# Patient Record
Sex: Female | Born: 1995
Health system: Southern US, Community
[De-identification: ages and names within clinical notes are randomized; demographics above are authoritative.]

## PROBLEM LIST (undated history)

## (undated) DIAGNOSIS — D709 Neutropenia, unspecified: Secondary | ICD-10-CM

## (undated) HISTORY — DX: Neutropenia, unspecified: D70.9

## (undated) HISTORY — PX: NO PAST SURGERIES: SHX2092

---
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
## 2017-03-13 ENCOUNTER — Ambulatory Visit (INDEPENDENT_AMBULATORY_CARE_PROVIDER_SITE_OTHER): Payer: Medicaid Other | Admitting: Family Medicine

## 2017-03-13 DIAGNOSIS — Z3042 Encounter for surveillance of injectable contraceptive: Secondary | ICD-10-CM

## 2017-03-13 MED ORDER — MEDROXYPROGESTERONE ACETATE 150 MG/ML IM SUSP
150.0000 mg | Freq: Once | INTRAMUSCULAR | Status: AC
  Administered 2017-03-13: 150 mg via INTRAMUSCULAR

## 2017-03-13 NOTE — Progress Notes (Signed)
Patient ID: Erica Johns, female   DOB: 1995-10-05, 21 y.o.   MRN: [id number]  Patient was seen in office today to receive her Depo Provera injection and states that she is feeling well today. Last depo injection was 12/20/16, patient was given injection in office today and tolerated injection well with no adverse reaction. Patient was instructed to return back to the office on or between the dates of 05/29/17-06/12/17. KW

## 2017-05-29 ENCOUNTER — Ambulatory Visit (INDEPENDENT_AMBULATORY_CARE_PROVIDER_SITE_OTHER): Payer: Medicaid Other | Admitting: Family Medicine

## 2017-05-29 DIAGNOSIS — Z3042 Encounter for surveillance of injectable contraceptive: Secondary | ICD-10-CM

## 2017-05-29 MED ORDER — MEDROXYPROGESTERONE ACETATE 150 MG/ML IM SUSP
150.0000 mg | Freq: Once | INTRAMUSCULAR | Status: AC
  Administered 2017-05-29: 150 mg via INTRAMUSCULAR

## 2017-05-29 NOTE — Progress Notes (Signed)
Pt is here today for her Depo provera injection today. Her last one was on 03/13/17. She was informed and scheduled to come back between 08/14/17 and 08/28/17 for her next injection. Pt tolerated injection well.

## 2017-08-14 ENCOUNTER — Ambulatory Visit (INDEPENDENT_AMBULATORY_CARE_PROVIDER_SITE_OTHER): Payer: Medicaid Other | Admitting: Family Medicine

## 2017-08-14 DIAGNOSIS — Z3042 Encounter for surveillance of injectable contraceptive: Secondary | ICD-10-CM | POA: Diagnosis not present

## 2017-08-14 MED ORDER — MEDROXYPROGESTERONE ACETATE 150 MG/ML IM SUSP
150.0000 mg | Freq: Once | INTRAMUSCULAR | Status: AC
  Administered 2017-08-14: 150 mg via INTRAMUSCULAR

## 2017-11-03 ENCOUNTER — Ambulatory Visit: Payer: Self-pay | Admitting: Family Medicine

## 2017-11-14 ENCOUNTER — Ambulatory Visit (INDEPENDENT_AMBULATORY_CARE_PROVIDER_SITE_OTHER): Payer: Medicaid Other | Admitting: Family Medicine

## 2017-11-14 DIAGNOSIS — Z3042 Encounter for surveillance of injectable contraceptive: Secondary | ICD-10-CM | POA: Diagnosis not present

## 2017-11-14 MED ORDER — MEDROXYPROGESTERONE ACETATE 150 MG/ML IM SUSP
150.0000 mg | Freq: Once | INTRAMUSCULAR | Status: AC
  Administered 2017-11-14: 150 mg via INTRAMUSCULAR

## 2017-11-14 NOTE — Progress Notes (Signed)
Pt here for her Depo injection her last on was 08/14/17. She was scheduled for her next today.

## 2017-11-18 ENCOUNTER — Telehealth: Payer: Self-pay

## 2017-11-18 NOTE — Telephone Encounter (Signed)
Patient requesting a copy of her immunizations. CB# [id number]

## 2017-11-18 NOTE — Telephone Encounter (Signed)
Immunization record printed at the front desk for pickup. Patient advised.

## 2018-01-30 ENCOUNTER — Encounter: Payer: Self-pay | Admitting: Family Medicine

## 2018-01-30 ENCOUNTER — Ambulatory Visit: Payer: Self-pay | Admitting: Family Medicine

## 2018-01-30 ENCOUNTER — Ambulatory Visit (INDEPENDENT_AMBULATORY_CARE_PROVIDER_SITE_OTHER): Payer: Medicaid Other | Admitting: Family Medicine

## 2018-01-30 DIAGNOSIS — Z3042 Encounter for surveillance of injectable contraceptive: Secondary | ICD-10-CM

## 2018-01-30 MED ORDER — MEDROXYPROGESTERONE ACETATE 150 MG/ML IM SUSP
150.0000 mg | Freq: Once | INTRAMUSCULAR | Status: AC
  Administered 2018-01-30: 150 mg via INTRAMUSCULAR

## 2018-01-30 NOTE — Progress Notes (Signed)
Patient was seen in office today to receive her Depo Provera injection and states that she is feeling well today. Last depo injection was 11/14/2017, patient was given injection in office today and tolerated injection well with no adverse reaction. Patient was instructed to return back to the office on or between the dates of 04/17/2018-05/01/2018

## 2018-04-17 ENCOUNTER — Ambulatory Visit: Payer: Medicaid Other | Admitting: Family Medicine

## 2018-05-19 ENCOUNTER — Ambulatory Visit (INDEPENDENT_AMBULATORY_CARE_PROVIDER_SITE_OTHER): Payer: Medicaid Other | Admitting: Family Medicine

## 2018-05-19 DIAGNOSIS — Z3042 Encounter for surveillance of injectable contraceptive: Secondary | ICD-10-CM | POA: Diagnosis not present

## 2018-05-19 LAB — POCT URINE PREGNANCY: PREG TEST UR: NEGATIVE

## 2018-05-19 MED ORDER — MEDROXYPROGESTERONE ACETATE 150 MG/ML IM SUSP
150.0000 mg | Freq: Once | INTRAMUSCULAR | Status: AC
  Administered 2018-05-19: 150 mg via INTRAMUSCULAR

## 2018-05-19 NOTE — Progress Notes (Deleted)
       Patient: Erica Johns Severe Female    DOB: 12-02-1995   22 y.o.   MRN: [id number] Visit Date: 05/19/2018  Today's Provider: Anola Gurneyobert Chauvin, PA   No chief complaint on file.  Subjective:     HPI  No Known Allergies   Current Outpatient Medications:  .  medroxyPROGESTERone (DEPO-PROVERA) 150 MG/ML injection, Inject 1 mL (150 mg total) into the muscle every 3 (three) months., Disp: 1 mL, Rfl: 3  Current Facility-Administered Medications:  .  medroxyPROGESTERone (DEPO-PROVERA) injection 150 mg, 150 mg, Intramuscular, Once, Anola Gurneyhauvin, Robert, PA  Review of Systems  Social History   Tobacco Use  . Smoking status: Never Smoker  . Smokeless tobacco: Never Used  Substance Use Topics  . Alcohol use: No    Alcohol/week: 0.0 standard drinks      Objective:   There were no vitals taken for this visit. There were no vitals filed for this visit.   Physical Exam      Assessment & Plan        Anola Gurneyobert Chauvin, PA  [facility]

## 2018-05-19 NOTE — Progress Notes (Signed)
Depo- Provera IM Injection  Contraception Management  Medication: Medroxyprogesterone Dose: 150mg /ML Location: right upper outer buttocks Lot: [id number] Exp:12/2018  Patient tolerated well, no complications were noted   Preformed by: Nicholos JohnsKathleen. W, NCMA  Follow up: between 08/05/18-08/19/18 for next injection

## 2018-06-10 ENCOUNTER — Encounter: Payer: Self-pay | Admitting: Family Medicine

## 2018-06-10 ENCOUNTER — Ambulatory Visit (INDEPENDENT_AMBULATORY_CARE_PROVIDER_SITE_OTHER): Payer: Medicaid Other | Admitting: Family Medicine

## 2018-06-10 VITALS — BP 106/60 | HR 76 | Temp 98.4°F | Resp 16 | Wt 117.0 lb

## 2018-06-10 DIAGNOSIS — R221 Localized swelling, mass and lump, neck: Secondary | ICD-10-CM

## 2018-06-10 NOTE — Progress Notes (Signed)
  Subjective:     Patient ID: Erica Johns, female   DOB: 01-24-1996, 23 y.o.   MRN: [id number] Chief Complaint  Patient presents with  . Mass    Right side of neck.  First noticed a few weeks ago.    HPI States she has not had a recent cold and only felt it while cleaning her neck. She is accompanied by her boyfriend today.  Review of Systems     Objective:   Physical Exam Constitutional:      General: She is not in acute distress. Skin:    Comments: Right anterior cervical area with pea-sized mobile, non-tender mass  Neurological:     Mental Status: She is alert.        Assessment:    1. Mass of right side of neck: ? Cyst-as patient is worried about this will refer for more definitive diagnosis. - Ambulatory referral to General Surgery    Plan:    Further f/u pending surgical evaluation.

## 2018-06-10 NOTE — Patient Instructions (Signed)
We will call you with the surgical referral.

## 2018-06-17 ENCOUNTER — Ambulatory Visit: Payer: Self-pay | Admitting: General Surgery

## 2018-08-05 ENCOUNTER — Ambulatory Visit: Payer: Self-pay

## 2018-08-13 ENCOUNTER — Ambulatory Visit (INDEPENDENT_AMBULATORY_CARE_PROVIDER_SITE_OTHER): Payer: Medicaid Other | Admitting: Physician Assistant

## 2018-08-13 ENCOUNTER — Other Ambulatory Visit: Payer: Self-pay

## 2018-08-13 ENCOUNTER — Encounter: Payer: Self-pay | Admitting: Physician Assistant

## 2018-08-13 VITALS — BP 109/72 | HR 81 | Temp 98.8°F | Wt 118.4 lb

## 2018-08-13 DIAGNOSIS — R0982 Postnasal drip: Secondary | ICD-10-CM

## 2018-08-13 DIAGNOSIS — Z3042 Encounter for surveillance of injectable contraceptive: Secondary | ICD-10-CM

## 2018-08-13 LAB — POCT URINE PREGNANCY: Preg Test, Ur: NEGATIVE

## 2018-08-13 MED ORDER — MEDROXYPROGESTERONE ACETATE 150 MG/ML IM SUSP
150.0000 mg | Freq: Once | INTRAMUSCULAR | Status: AC
  Administered 2018-08-13: 150 mg via INTRAMUSCULAR

## 2018-08-13 NOTE — Progress Notes (Signed)
       Patient: Erica Johns Female    DOB: [birth year]   22 y.o.   MRN: [id number] Visit Date: 08/16/2018  Today's Provider: Trey Sailors, PA-C   Chief Complaint  Patient presents with  . URI   Subjective:    URI   The current episode started in the past 7 days. The problem has been unchanged. There has been no fever. Associated symptoms include congestion and coughing. She has tried decongestant, acetaminophen and NSAIDs for the symptoms. The treatment provided mild relief.   Patient reports she feels like she has mucous running down the back of her throat. Denies travel, contacts with sick people who have traveled.   No Known Allergies   Current Outpatient Medications:  .  medroxyPROGESTERone (DEPO-PROVERA) 150 MG/ML injection, Inject 1 mL (150 mg total) into the muscle every 3 (three) months., Disp: 1 mL, Rfl: 3  Review of Systems  Constitutional: Negative.   HENT: Positive for congestion.   Respiratory: Positive for cough.   Cardiovascular: Negative.   Gastrointestinal: Negative.   Neurological: Negative.     Social History   Tobacco Use  . Smoking status: Never Smoker  . Smokeless tobacco: Never Used  Substance Use Topics  . Alcohol use: No    Alcohol/week: 0.0 standard drinks      Objective:   BP 109/72 (BP Location: Left Arm, Patient Position: Sitting, Cuff Size: Normal)   Pulse 81   Temp 98.8 F (37.1 C) (Oral)   Wt 118 lb 6.4 oz (53.7 kg)   LMP 07/17/2018   SpO2 99%  Vitals:   08/13/18 1625  BP: 109/72  Pulse: 81  Temp: 98.8 F (37.1 C)  TempSrc: Oral  SpO2: 99%  Weight: 118 lb 6.4 oz (53.7 kg)     Physical Exam Constitutional:      Appearance: Normal appearance.  HENT:     Right Ear: Tympanic membrane and ear canal normal.     Left Ear: Tympanic membrane and ear canal normal.     Mouth/Throat:     Mouth: Mucous membranes are moist.     Pharynx: Oropharynx is clear.  Cardiovascular:     Rate and Rhythm: Normal rate and  regular rhythm.     Pulses: Normal pulses.     Heart sounds: Normal heart sounds.  Pulmonary:     Effort: Pulmonary effort is normal.     Breath sounds: Normal breath sounds.  Lymphadenopathy:     Cervical: No cervical adenopathy.  Skin:    General: Skin is warm and dry.  Neurological:     Mental Status: She is alert and oriented to person, place, and time. Mental status is at baseline.  Psychiatric:        Mood and Affect: Mood normal.        Behavior: Behavior normal.         Assessment & Plan    1. Encounter for Depo-Provera contraception  Urine pregnancy negative.  - POCT urine pregnancy - medroxyPROGESTERone (DEPO-PROVERA) injection 150 mg  2. PND (post-nasal drip)  2nd gen antihistamine.  F/u 3 mo for depo.      Trey Sailors, PA-C  [facility]

## 2018-08-16 NOTE — Patient Instructions (Signed)
Postnasal Drip  Postnasal drip is the feeling of mucus going down the back of your throat. Mucus is a slimy substance that moistens and cleans your nose and throat, as well as the air pockets in face bones near your forehead and cheeks (sinuses). Small amounts of mucus pass from your nose and sinuses down the back of your throat all the time. This is normal. When you produce too much mucus or the mucus gets too thick, you can feel it.  Some common causes of postnasal drip include:   Having more mucus because of:  ? A cold or the flu.  ? Allergies.  ? Cold air.  ? Certain medicines.   Having more mucus that is thicker because of:  ? A sinus or nasal infection.  ? Dry air.  ? A food allergy.  Follow these instructions at home:  Relieving discomfort     Gargle with a salt-water mixture 3-4 times a day or as needed. To make a salt-water mixture, completely dissolve -1 tsp of salt in 1 cup of warm water.   If the air in your home is dry, use a humidifier to add moisture to the air.   Use a saline spray or container (neti pot) to flush out the nose (nasal irrigation). These methods can help clear away mucus and keep the nasal passages moist.  General instructions   Take over-the-counter and prescription medicines only as told by your health care provider.   Follow instructions from your health care provider about eating or drinking restrictions. You may need to avoid caffeine.   Avoid things that you know you are allergic to (allergens), like dust, mold, pollen, pets, or certain foods.   Drink enough fluid to keep your urine pale yellow.   Keep all follow-up visits as told by your health care provider. This is important.  Contact a health care provider if:   You have a fever.   You have a sore throat.   You have difficulty swallowing.   You have headache.   You have sinus pain.   You have a cough that does not go away.   The mucus from your nose becomes thick and is green or yellow in color.   You have  cold or flu symptoms that last more than 10 days.  Summary   Postnasal drip is the feeling of mucus going down the back of your throat.   If your health care provider approves, use nasal irrigation or a nasal spray 2?4 times a day.   Avoid things that you know you are allergic to (allergens), like dust, mold, pollen, pets, or certain foods.  This information is not intended to replace advice given to you by your health care provider. Make sure you discuss any questions you have with your health care provider.  Document Released: 09/02/2016 Document Revised: 09/02/2016 Document Reviewed: 09/02/2016  Elsevier Interactive Patient Education  2019 [employer]

## 2018-11-04 ENCOUNTER — Encounter: Payer: Self-pay | Admitting: *Deleted

## 2018-11-13 ENCOUNTER — Ambulatory Visit: Payer: Self-pay | Admitting: Physician Assistant

## 2018-11-16 ENCOUNTER — Encounter: Payer: Self-pay | Admitting: *Deleted

## 2018-11-19 NOTE — Progress Notes (Signed)
resent

## 2018-12-11 ENCOUNTER — Other Ambulatory Visit: Payer: Self-pay

## 2018-12-11 ENCOUNTER — Ambulatory Visit (INDEPENDENT_AMBULATORY_CARE_PROVIDER_SITE_OTHER): Payer: Medicaid Other | Admitting: Physician Assistant

## 2018-12-11 VITALS — Temp 98.7°F

## 2018-12-11 DIAGNOSIS — Z3042 Encounter for surveillance of injectable contraceptive: Secondary | ICD-10-CM

## 2018-12-11 LAB — POCT URINE PREGNANCY: Preg Test, Ur: NEGATIVE

## 2018-12-11 MED ORDER — MEDROXYPROGESTERONE ACETATE 150 MG/ML IM SUSP
150.0000 mg | Freq: Once | INTRAMUSCULAR | Status: AC
  Administered 2018-12-11: 150 mg via INTRAMUSCULAR

## 2018-12-11 NOTE — Progress Notes (Signed)
Patient here today for medroxyProgesterone. Patient last had injection on 08/13/2018. A POC pregnancy test was done today and was negative. Patient tolerated injection well. Patient reports good tolerance of medication.

## 2019-02-26 ENCOUNTER — Ambulatory Visit: Payer: Self-pay | Admitting: Physician Assistant

## 2019-04-27 ENCOUNTER — Other Ambulatory Visit: Payer: Self-pay

## 2019-04-27 ENCOUNTER — Ambulatory Visit (INDEPENDENT_AMBULATORY_CARE_PROVIDER_SITE_OTHER): Payer: Medicaid Other | Admitting: Physician Assistant

## 2019-04-27 DIAGNOSIS — Z3042 Encounter for surveillance of injectable contraceptive: Secondary | ICD-10-CM

## 2019-04-27 DIAGNOSIS — Z3200 Encounter for pregnancy test, result unknown: Secondary | ICD-10-CM

## 2019-04-27 LAB — POCT URINE PREGNANCY: Preg Test, Ur: NEGATIVE

## 2019-04-27 MED ORDER — METHYLPREDNISOLONE ACETATE 40 MG/ML IJ SUSP
40.0000 mg | Freq: Once | INTRAMUSCULAR | Status: AC
  Administered 2019-04-27: 40 mg via INTRAMUSCULAR

## 2019-04-28 ENCOUNTER — Ambulatory Visit (INDEPENDENT_AMBULATORY_CARE_PROVIDER_SITE_OTHER): Payer: Medicaid Other | Admitting: Physician Assistant

## 2019-04-28 DIAGNOSIS — Z3042 Encounter for surveillance of injectable contraceptive: Secondary | ICD-10-CM

## 2019-04-28 MED ORDER — MEDROXYPROGESTERONE ACETATE 150 MG/ML IM SUSP
150.0000 mg | Freq: Once | INTRAMUSCULAR | Status: AC
  Administered 2019-04-28: 150 mg via INTRAMUSCULAR

## 2019-04-28 NOTE — Progress Notes (Signed)
Patient presented today for depo-provera birth control shot. Patient was mistakenly given 40 mg depo-medrol. As soon as this was realized, I personally called patient at approximately 3:00 PM on 04/27/2019 and explained the error. Counseled patient that the medication should not harm her and there is no reason she could not have it. Have explained risk of some discoloration or atrophy at the injection site and symptoms to watch out for. We will have her return tomorrow for the depo-provera shot.

## 2019-04-28 NOTE — Progress Notes (Signed)
       Patient: Erica Johns Female    DOB: 07-18-1995   23 y.o.   MRN: [id number] Visit Date: 04/28/2019  Today's Provider: Trinna Post, PA-C   Chief Complaint  Patient presents with  . Encounter for Depo-Provera Contraception   Subjective:     HPI  Encounter for Depo-Provera Contraception Patient presents today for depo-provera injection. Patient last injection was on 12/11/2018. POC pregnancy test was done on 04/27/2019 and was negative. Patient tolerated injection well.  Patient presents today for birth control injection. Yesterday she was mistakenly given 40 mg depo-medrol rather than depo-provera. Patient was alerted to mistake. She reports some slight soreness at injection site but otherwise no adverse reactions today.    No Known Allergies  No current outpatient medications on file.  Current Facility-Administered Medications:  .  medroxyPROGESTERone (DEPO-PROVERA) injection 150 mg, 150 mg, Intramuscular, Once, [place], Jazalyn M, PA-C  Review of Systems  Social History   Tobacco Use  . Smoking status: Never Smoker  . Smokeless tobacco: Never Used  Substance Use Topics  . Alcohol use: No    Alcohol/week: 0.0 standard drinks      Objective:   There were no vitals taken for this visit. There were no vitals filed for this visit.There is no height or weight on file to calculate BMI.   Physical Exam Constitutional:      Appearance: Normal appearance.  Cardiovascular:     Rate and Rhythm: Normal rate and regular rhythm.  Pulmonary:     Effort: Pulmonary effort is normal.     Breath sounds: Normal breath sounds.  Skin:    General: Skin is warm and dry.  Neurological:     Mental Status: She is alert and oriented to person, place, and time. Mental status is at baseline.  Psychiatric:        Mood and Affect: Mood normal.        Behavior: Behavior normal.      No results found for any visits on 04/28/19.     Assessment & Plan    1. Encounter  for Depo-Provera contraception   We will have her come back next year for CPE and PAP as she has turned 23.   - medroxyPROGESTERone (DEPO-PROVERA) injection 150 mg  The entirety of the information documented in the History of Present Illness, Review of Systems and Physical Exam were personally obtained by me. Portions of this information were initially documented by [facility], CMA and reviewed by me for thoroughness and accuracy.          Trinna Post, PA-C  [facility]

## 2019-07-29 ENCOUNTER — Other Ambulatory Visit: Payer: Self-pay

## 2019-07-29 ENCOUNTER — Encounter: Payer: Self-pay | Admitting: Physician Assistant

## 2019-07-29 ENCOUNTER — Ambulatory Visit (INDEPENDENT_AMBULATORY_CARE_PROVIDER_SITE_OTHER): Payer: BC Managed Care – PPO | Admitting: Physician Assistant

## 2019-07-29 ENCOUNTER — Other Ambulatory Visit (HOSPITAL_COMMUNITY)
Admission: RE | Admit: 2019-07-29 | Discharge: 2019-07-29 | Disposition: A | Discharge status: Home / Self Care | Payer: BC Managed Care – PPO | Source: Ambulatory Visit | Attending: Physician Assistant | Admitting: Physician Assistant

## 2019-07-29 VITALS — BP 111/66 | HR 80 | Temp 96.8°F | Ht 64.0 in | Wt 122.8 lb

## 2019-07-29 DIAGNOSIS — Z0000 Encounter for general adult medical examination without abnormal findings: Secondary | ICD-10-CM

## 2019-07-29 DIAGNOSIS — Z3042 Encounter for surveillance of injectable contraceptive: Secondary | ICD-10-CM | POA: Diagnosis not present

## 2019-07-29 DIAGNOSIS — D709 Neutropenia, unspecified: Secondary | ICD-10-CM | POA: Diagnosis not present

## 2019-07-29 DIAGNOSIS — Z124 Encounter for screening for malignant neoplasm of cervix: Secondary | ICD-10-CM

## 2019-07-29 MED ORDER — MEDROXYPROGESTERONE ACETATE 150 MG/ML IM SUSP
150.0000 mg | Freq: Once | INTRAMUSCULAR | Status: AC
  Administered 2019-07-29: 150 mg via INTRAMUSCULAR

## 2019-07-29 NOTE — Progress Notes (Signed)
Patient: Erica Johns, Female    DOB: 07-Nov-1995, 24 y.o.   MRN: [id number] Visit Date: 07/29/2019  Today's Provider: Trinna Post, PA-C   No chief complaint on file.  Subjective:    Annual physical exam Erica Johns is a 24 y.o. female who presents today for health maintenance and complete physical. She feels well. She reports she is not exercising. She reports she is sleeping well.  Due for PAP and tetanus shot today.  -----------------------------------------------------------------   Review of Systems  Constitutional: Negative.   HENT: Negative.   Eyes: Negative.   Respiratory: Negative.   Cardiovascular: Negative.   Gastrointestinal: Negative.   Endocrine: Negative.   Genitourinary: Negative.   Musculoskeletal: Negative.   Skin: Negative.   Allergic/Immunologic: Negative.   Neurological: Negative.   Hematological: Negative.   Psychiatric/Behavioral: Negative.     Social History She  reports that she has never smoked. She has never used smokeless tobacco. She reports that she does not drink alcohol or use drugs. Social History   Socioeconomic History  . Marital status: Single    Spouse name: Not on file  . Number of children: Not on file  . Years of education: Not on file  . Highest education level: Not on file  Occupational History  . Not on file  Tobacco Use  . Smoking status: Never Smoker  . Smokeless tobacco: Never Used  Substance and Sexual Activity  . Alcohol use: No    Alcohol/week: 0.0 standard drinks  . Drug use: No  . Sexual activity: Not on file  Other Topics Concern  . Not on file  Social History Narrative  . Not on file   Social Determinants of Health   Financial Resource Strain:   . Difficulty of Paying Living Expenses: Not on file  Food Insecurity:   . Worried About Charity fundraiser in the Last Year: Not on file  . Ran Out of Food in the Last Year: Not on file  Transportation Needs:   . Lack of  Transportation (Medical): Not on file  . Lack of Transportation (Non-Medical): Not on file  Physical Activity:   . Days of Exercise per Week: Not on file  . Minutes of Exercise per Session: Not on file  Stress:   . Feeling of Stress : Not on file  Social Connections:   . Frequency of Communication with Friends and Family: Not on file  . Frequency of Social Gatherings with Friends and Family: Not on file  . Attends Religious Services: Not on file  . Active Member of Clubs or Organizations: Not on file  . Attends Archivist Meetings: Not on file  . Marital Status: Not on file    There are no problems to display for this patient.   Past Surgical History:  Procedure Laterality Date  . NO PAST SURGERIES      Family History  Family Status  Relation Name Status  . Mother  Alive  . Father  Alive  . Sister  Alive  . Brother  Alive  . MGM  Deceased  . MGF  Alive  . PGM  Alive  . PGF  Deceased   Her family history includes Diabetes in her maternal grandmother; Healthy in her brother, father, maternal grandfather, mother, paternal grandmother, and sister.     No Known Allergies  Previous Medications   No medications on file    Patient Care Team: [place], [place] as  PCP - General (Physician Assistant)      Objective:   Vitals: There were no vitals taken for this visit.   Physical Exam Constitutional:      Appearance: Normal appearance.  Cardiovascular:     Rate and Rhythm: Normal rate and regular rhythm.     Heart sounds: Normal heart sounds.  Pulmonary:     Effort: Pulmonary effort is normal.     Breath sounds: Normal breath sounds.  Chest:     Chest wall: No mass.     Breasts:        Right: Normal.        Left: Normal.  Abdominal:     General: Bowel sounds are normal.     Palpations: Abdomen is soft.     Tenderness: There is no abdominal tenderness.  Skin:    General: Skin is warm and dry.  Neurological:     Mental Status: She is alert  and oriented to person, place, and time. Mental status is at baseline.  Psychiatric:        Mood and Affect: Mood normal.        Behavior: Behavior normal.      Depression Screen No flowsheet data found.    Assessment & Plan:     Routine Health Maintenance and Physical Exam  Exercise Activities and Dietary recommendations Goals   None     Immunization History  Administered Date(s) Administered  . DTaP 12/27/1997, 03/08/1998, 05/24/1998, 06/14/1999, 08/13/2002  . HPV Quadrivalent 09/02/2014  . Hepatitis A 09/02/2014  . Hepatitis B Oct 13, 1995, 11/25/1997, 05/24/1998  . HiB (PRP-OMP) 12/27/1997, 03/08/1998, 05/24/1998, 01/22/1999  . IPV 12/27/1997, 03/08/1998, 05/24/1998, 08/13/2002  . MMR 06/14/1999, 08/13/2002  . Meningococcal Conjugate 09/02/2014  . Td 11/28/2008  . Tdap 11/28/2008  . Varicella 01/22/1999, 12/27/2008    Health Maintenance  Topic Date Due  . PAP-Cervical Cytology Screening  10/27/2018  . PAP SMEAR-Modifier  10/27/2018  . TETANUS/TDAP  11/29/2018  . INFLUENZA VACCINE  01/02/2019  . HIV Screening  04/27/2020 (Originally 10/26/2012)     Discussed health benefits of physical activity, and encouraged her to engage in regular exercise appropriate for her age and condition.    1. Annual physical exam   2. Encounter for Depo-Provera contraception  - medroxyPROGESTERone (DEPO-PROVERA) injection 150 mg  3. Encounter for annual physical examination excluding gynecological examination in a patient older than 17 years  - Cytology - PAP - HIV Antibody (routine testing w rflx) - TSH - Lipid panel - Comprehensive metabolic panel - CBC with Differential/Platelet  4. Cervical cancer screening  The entirety of the information documented in the History of Present Illness, Review of Systems and Physical Exam were personally obtained by me. Portions of this information were initially documented by Elonda Husky, CMA and reviewed by me for thoroughness  and accuracy.    --------------------------------------------------------------------

## 2019-07-29 NOTE — Patient Instructions (Signed)
Health Maintenance, Female Adopting a healthy lifestyle and getting preventive care are important in promoting health and wellness. Ask your health care provider about:  The right schedule for you to have regular tests and exams.  Things you can do on your own to prevent diseases and keep yourself healthy. What should I know about diet, weight, and exercise? Eat a healthy diet   Eat a diet that includes plenty of vegetables, fruits, low-fat dairy products, and lean protein.  Do not eat a lot of foods that are high in solid fats, added sugars, or sodium. Maintain a healthy weight Body mass index (BMI) is used to identify weight problems. It estimates body fat based on height and weight. Your health care provider can help determine your BMI and help you achieve or maintain a healthy weight. Get regular exercise Get regular exercise. This is one of the most important things you can do for your health. Most adults should:  Exercise for at least 150 minutes each week. The exercise should increase your heart rate and make you sweat (moderate-intensity exercise).  Do strengthening exercises at least twice a week. This is in addition to the moderate-intensity exercise.  Spend less time sitting. Even light physical activity can be beneficial. Watch cholesterol and blood lipids Have your blood tested for lipids and cholesterol at 22 years of age, then have this test every 5 years. Have your cholesterol levels checked more often if:  Your lipid or cholesterol levels are high.  You are older than 22 years of age.  You are at high risk for heart disease. What should I know about cancer screening? Depending on your health history and family history, you may need to have cancer screening at various ages. This may include screening for:  Breast cancer.  Cervical cancer.  Colorectal cancer.  Skin cancer.  Lung cancer. What should I know about heart disease, diabetes, and high blood  pressure? Blood pressure and heart disease  High blood pressure causes heart disease and increases the risk of stroke. This is more likely to develop in people who have high blood pressure readings, are of African descent, or are overweight.  Have your blood pressure checked: ? Every 3-5 years if you are 18-39 years of age. ? Every year if you are 40 years old or older. Diabetes Have regular diabetes screenings. This checks your fasting blood sugar level. Have the screening done:  Once every three years after age 40 if you are at a normal weight and have a low risk for diabetes.  More often and at a younger age if you are overweight or have a high risk for diabetes. What should I know about preventing infection? Hepatitis B If you have a higher risk for hepatitis B, you should be screened for this virus. Talk with your health care provider to find out if you are at risk for hepatitis B infection. Hepatitis C Testing is recommended for:  Everyone born from 1945 through 1965.  Anyone with known risk factors for hepatitis C. Sexually transmitted infections (STIs)  Get screened for STIs, including gonorrhea and chlamydia, if: ? You are sexually active and are younger than 22 years of age. ? You are older than 22 years of age and your health care provider tells you that you are at risk for this type of infection. ? Your sexual activity has changed since you were last screened, and you are at increased risk for chlamydia or gonorrhea. Ask your health care provider if   you are at risk.  Ask your health care provider about whether you are at high risk for HIV. Your health care provider may recommend a prescription medicine to help prevent HIV infection. If you choose to take medicine to prevent HIV, you should first get tested for HIV. You should then be tested every 3 months for as long as you are taking the medicine. Pregnancy  If you are about to stop having your period (premenopausal) and  you may become pregnant, seek counseling before you get pregnant.  Take 400 to 800 micrograms (mcg) of folic acid every day if you become pregnant.  Ask for birth control (contraception) if you want to prevent pregnancy. Osteoporosis and menopause Osteoporosis is a disease in which the bones lose minerals and strength with aging. This can result in bone fractures. If you are 65 years old or older, or if you are at risk for osteoporosis and fractures, ask your health care provider if you should:  Be screened for bone loss.  Take a calcium or vitamin D supplement to lower your risk of fractures.  Be given hormone replacement therapy (HRT) to treat symptoms of menopause. Follow these instructions at home: Lifestyle  Do not use any products that contain nicotine or tobacco, such as cigarettes, e-cigarettes, and chewing tobacco. If you need help quitting, ask your health care provider.  Do not use street drugs.  Do not share needles.  Ask your health care provider for help if you need support or information about quitting drugs. Alcohol use  Do not drink alcohol if: ? Your health care provider tells you not to drink. ? You are pregnant, may be pregnant, or are planning to become pregnant.  If you drink alcohol: ? Limit how much you use to 0-1 drink a day. ? Limit intake if you are breastfeeding.  Be aware of how much alcohol is in your drink. In the [place] one drink equals one 12 oz bottle of beer (355 mL), one 5 oz glass of wine (148 mL), or one 1 oz glass of hard liquor (44 mL). General instructions  Schedule regular health, dental, and eye exams.  Stay current with your vaccines.  Tell your health care provider if: ? You often feel depressed. ? You have ever been abused or do not feel safe at home. Summary  Adopting a healthy lifestyle and getting preventive care are important in promoting health and wellness.  Follow your health care provider's instructions about healthy  diet, exercising, and getting tested or screened for diseases.  Follow your health care provider's instructions on monitoring your cholesterol and blood pressure. This information is not intended to replace advice given to you by your health care provider. Make sure you discuss any questions you have with your health care provider. Document Revised: 05/13/2018 Document Reviewed: 05/13/2018 Elsevier Patient Education  2020 Elsevier Inc.  

## 2019-07-30 LAB — LIPID PANEL
Chol/HDL Ratio: 2.3 ratio (ref 0.0–4.4)
Cholesterol, Total: 131 mg/dL (ref 100–199)
HDL: 58 mg/dL (ref >39)
LDL Chol Calc (NIH): 59 mg/dL (ref 0–99)
Triglycerides: 67 mg/dL (ref 0–149)
VLDL Cholesterol Cal: 14 mg/dL (ref 5–40)

## 2019-07-30 LAB — COMPREHENSIVE METABOLIC PANEL
ALT: 27 IU/L (ref 0–32)
AST: 22 IU/L (ref 0–40)
Albumin/Globulin Ratio: 1.9 (ref 1.2–2.2)
Albumin: 4.6 g/dL (ref 3.9–5.0)
Alkaline Phosphatase: 87 IU/L (ref 39–117)
BUN/Creatinine Ratio: 16 (ref 9–23)
BUN: 11 mg/dL (ref 6–20)
Bilirubin Total: 0.4 mg/dL (ref 0.0–1.2)
CO2: 22 mmol/L (ref 20–29)
Calcium: 9.4 mg/dL (ref 8.7–10.2)
Chloride: 105 mmol/L (ref 96–106)
Creatinine, Ser: 0.7 mg/dL (ref 0.57–1.00)
GFR calc Af Amer: 143 mL/min/{1.73_m2} (ref >59)
GFR calc non Af Amer: 124 mL/min/{1.73_m2} (ref >59)
Globulin, Total: 2.4 g/dL (ref 1.5–4.5)
Glucose: 64 mg/dL — ABNORMAL LOW (ref 65–99)
Potassium: 3.7 mmol/L (ref 3.5–5.2)
Sodium: 141 mmol/L (ref 134–144)
Total Protein: 7 g/dL (ref 6.0–8.5)

## 2019-07-30 LAB — CYTOLOGY - PAP
Chlamydia: NEGATIVE
Comment: NEGATIVE
Comment: NORMAL
Diagnosis: NEGATIVE
Neisseria Gonorrhea: NEGATIVE

## 2019-07-30 LAB — CBC WITH DIFFERENTIAL/PLATELET
Basophils Absolute: 0 10*3/uL (ref 0.0–0.2)
Basos: 0 %
EOS (ABSOLUTE): 0.1 10*3/uL (ref 0.0–0.4)
Eos: 3 %
Hematocrit: 37.2 % (ref 34.0–46.6)
Hemoglobin: 11.7 g/dL (ref 11.1–15.9)
Immature Grans (Abs): 0 10*3/uL (ref 0.0–0.1)
Immature Granulocytes: 0 %
Lymphocytes Absolute: 1.3 10*3/uL (ref 0.7–3.1)
Lymphs: 55 %
MCH: 26.4 pg — ABNORMAL LOW (ref 26.6–33.0)
MCHC: 31.5 g/dL (ref 31.5–35.7)
MCV: 84 fL (ref 79–97)
Monocytes Absolute: 0.2 10*3/uL (ref 0.1–0.9)
Monocytes: 10 %
Neutrophils Absolute: 0.7 10*3/uL — ABNORMAL LOW (ref 1.4–7.0)
Neutrophils: 32 %
Platelets: 184 10*3/uL (ref 150–450)
RBC: 4.43 x10E6/uL (ref 3.77–5.28)
RDW: 12.2 % (ref 11.7–15.4)
WBC: 2.4 10*3/uL — CL (ref 3.4–10.8)

## 2019-07-30 LAB — HIV ANTIBODY (ROUTINE TESTING W REFLEX): HIV Screen 4th Generation wRfx: NONREACTIVE

## 2019-07-30 LAB — TSH: TSH: 1.9 u[IU]/mL (ref 0.450–4.500)

## 2019-08-02 NOTE — Addendum Note (Signed)
Addended by: Fonda Kinder on: 08/02/2019 02:12 PM   Modules accepted: Orders

## 2019-08-12 ENCOUNTER — Other Ambulatory Visit: Payer: Self-pay

## 2019-08-12 DIAGNOSIS — D709 Neutropenia, unspecified: Secondary | ICD-10-CM

## 2019-08-13 ENCOUNTER — Telehealth: Payer: Self-pay

## 2019-08-13 DIAGNOSIS — D709 Neutropenia, unspecified: Secondary | ICD-10-CM

## 2019-08-13 LAB — CBC WITH DIFFERENTIAL/PLATELET
Basophils Absolute: 0 10*3/uL (ref 0.0–0.2)
Basos: 0 %
EOS (ABSOLUTE): 0.1 10*3/uL (ref 0.0–0.4)
Eos: 2 %
Hematocrit: 37 % (ref 34.0–46.6)
Hemoglobin: 11.7 g/dL (ref 11.1–15.9)
Immature Grans (Abs): 0 10*3/uL (ref 0.0–0.1)
Immature Granulocytes: 0 %
Lymphocytes Absolute: 1.4 10*3/uL (ref 0.7–3.1)
Lymphs: 54 %
MCH: 26.4 pg — ABNORMAL LOW (ref 26.6–33.0)
MCHC: 31.6 g/dL (ref 31.5–35.7)
MCV: 84 fL (ref 79–97)
Monocytes Absolute: 0.3 10*3/uL (ref 0.1–0.9)
Monocytes: 12 %
Neutrophils Absolute: 0.8 10*3/uL — ABNORMAL LOW (ref 1.4–7.0)
Neutrophils: 32 %
Platelets: 186 10*3/uL (ref 150–450)
RBC: 4.43 x10E6/uL (ref 3.77–5.28)
RDW: 12.4 % (ref 11.7–15.4)
WBC: 2.6 10*3/uL — ABNORMAL LOW (ref 3.4–10.8)

## 2019-08-13 NOTE — Telephone Encounter (Signed)
-----   Message from Trey Sailors, [place] sent at 08/13/2019  5:17 PM EST ----- Her bloodwork demonstrated low white blood cells again, though slightly improved from last time. This can have many causes including genetics, deficiencies among other things. I would recommend she see a hematologist which is a specialist who deals with blood disorders for further evaluation. Let me know if she would like me to place the referral.

## 2019-08-13 NOTE — Telephone Encounter (Signed)
Patient advised as directed below and she is willing for the referral to be place.

## 2019-08-23 ENCOUNTER — Inpatient Hospital Stay: Payer: BC Managed Care – PPO | Attending: Oncology | Admitting: Oncology

## 2019-08-23 ENCOUNTER — Inpatient Hospital Stay: Payer: BC Managed Care – PPO

## 2019-08-23 ENCOUNTER — Other Ambulatory Visit: Payer: Self-pay

## 2019-08-23 ENCOUNTER — Encounter: Payer: Self-pay | Admitting: Oncology

## 2019-08-23 VITALS — BP 106/84 | HR 80 | Temp 96.9°F | Ht 64.0 in | Wt 123.0 lb

## 2019-08-23 DIAGNOSIS — D709 Neutropenia, unspecified: Secondary | ICD-10-CM | POA: Diagnosis not present

## 2019-08-23 LAB — CBC WITH DIFFERENTIAL/PLATELET
Abs Immature Granulocytes: 0.01 10*3/uL (ref 0.00–0.07)
Basophils Absolute: 0 10*3/uL (ref 0.0–0.1)
Basophils Relative: 0 %
Eosinophils Absolute: 0.1 10*3/uL (ref 0.0–0.5)
Eosinophils Relative: 3 %
HCT: 39.1 % (ref 36.0–46.0)
Hemoglobin: 12.4 g/dL (ref 12.0–15.0)
Immature Granulocytes: 0 %
Lymphocytes Relative: 65 %
Lymphs Abs: 1.7 10*3/uL (ref 0.7–4.0)
MCH: 26.7 pg (ref 26.0–34.0)
MCHC: 31.7 g/dL (ref 30.0–36.0)
MCV: 84.3 fL (ref 80.0–100.0)
Monocytes Absolute: 0.2 10*3/uL (ref 0.1–1.0)
Monocytes Relative: 9 %
Neutro Abs: 0.6 10*3/uL — ABNORMAL LOW (ref 1.7–7.7)
Neutrophils Relative %: 23 %
Platelets: 181 10*3/uL (ref 150–400)
RBC: 4.64 MIL/uL (ref 3.87–5.11)
RDW: 12.8 % (ref 11.5–15.5)
WBC: 2.7 10*3/uL — ABNORMAL LOW (ref 4.0–10.5)
nRBC: 0 % (ref 0.0–0.2)

## 2019-08-23 LAB — TECHNOLOGIST SMEAR REVIEW
Plt Morphology: ADEQUATE
RBC Morphology: NORMAL

## 2019-08-23 LAB — HEPATITIS C ANTIBODY: HCV Ab: NONREACTIVE

## 2019-08-23 LAB — FOLATE: Folate: 26 ng/mL (ref >5.9)

## 2019-08-23 LAB — VITAMIN B12: Vitamin B-12: 740 pg/mL (ref 180–914)

## 2019-08-23 LAB — HIV ANTIBODY (ROUTINE TESTING W REFLEX): HIV Screen 4th Generation wRfx: NONREACTIVE

## 2019-08-23 NOTE — Progress Notes (Signed)
Patient is here today to establish care for Neutropenia.

## 2019-08-24 ENCOUNTER — Encounter: Payer: Self-pay | Admitting: Oncology

## 2019-08-24 NOTE — Progress Notes (Signed)
Hematology/Oncology Consult note [facility] Telephone:([phone] [phone]  Patient Care Team: Paulene Floor as PCP - General (Physician Assistant)   Name of the patient: Erica Johns  [id number]  01-20-1996    Reason for referral-neutropenia   Referring physician-Jeraldean Terrilee Croak PA  Date of visit: 08/24/19   History of presenting illness- Patient is a 24 year old African-American female referred to [place] for leukopenia.  Most recent CBC from 08/12/2019 showed white cell count of 2.6, H&H of 11.7/37 and platelet count of 186.  Differential showed ANC of 800.  Prior to that a month ago patient's white cell count was 2.4 with an [place] of 700.  I do not have any prior CBCs for comparison.  Patient feels well overall and has not had any significant fatigue, unintentional weight loss, drenching night sweats.  Denies any recurrent infections or hospitalizations.  Denies any recurrent infections growing up.  She is here with her father today who reports no family history of leukopenia.  Patient denies any over-the-counter medications or herbal supplements.  Denies any symptoms of skin rash joint pain or joint swelling.  No family history of any autoimmune disorders  ECOG PS- 0  Pain scale- 0   Review of systems- Review of Systems  Constitutional: Negative for chills, fever, malaise/fatigue and weight loss.  HENT: Negative for congestion, ear discharge and nosebleeds.   Eyes: Negative for blurred vision.  Respiratory: Negative for cough, hemoptysis, sputum production, shortness of breath and wheezing.   Cardiovascular: Negative for chest pain, palpitations, orthopnea and claudication.  Gastrointestinal: Negative for abdominal pain, blood in stool, constipation, diarrhea, heartburn, melena, nausea and vomiting.  Genitourinary: Negative for dysuria, flank pain, frequency, hematuria and urgency.  Musculoskeletal: Negative for back pain, joint pain and  myalgias.  Skin: Negative for rash.  Neurological: Negative for dizziness, tingling, focal weakness, seizures, weakness and headaches.  Endo/Heme/Allergies: Does not bruise/bleed easily.  Psychiatric/Behavioral: Negative for depression and suicidal ideas. The patient does not have insomnia.     No Known Allergies  There are no problems to display for this patient.    Past Medical History:  Diagnosis Date  . Neutropenia [facility])      Past Surgical History:  Procedure Laterality Date  . NO PAST SURGERIES      Social History   Socioeconomic History  . Marital status: Single    Spouse name: Not on file  . Number of children: Not on file  . Years of education: Not on file  . Highest education level: Not on file  Occupational History  . Not on file  Tobacco Use  . Smoking status: Never Smoker  . Smokeless tobacco: Never Used  Substance and Sexual Activity  . Alcohol use: No    Alcohol/week: 0.0 standard drinks  . Drug use: No  . Sexual activity: Not on file  Other Topics Concern  . Not on file  Social History Narrative  . Not on file   Social Determinants of Health   Financial Resource Strain:   . Difficulty of Paying Living Expenses:   Food Insecurity:   . Worried About Charity fundraiser in the Last Year:   . Arboriculturist in the Last Year:   Transportation Needs:   . Film/video editor (Medical):   Marland Kitchen Lack of Transportation (Non-Medical):   Physical Activity:   . Days of Exercise per Week:   . Minutes of Exercise per Session:   Stress:   . Feeling  of Stress :   Social Connections:   . Frequency of Communication with Friends and Family:   . Frequency of Social Gatherings with Friends and Family:   . Attends Religious Services:   . Active Member of Clubs or Organizations:   . Attends Archivist Meetings:   Marland Kitchen Marital Status:   Intimate Partner Violence:   . Fear of Current or Ex-Partner:   . Emotionally Abused:   Marland Kitchen Physically Abused:   .  Sexually Abused:      Family History  Problem Relation Age of Onset  . Healthy Mother   . Healthy Father   . Healthy Sister   . Healthy Brother   . Diabetes Maternal Grandmother   . Healthy Maternal Grandfather   . Healthy Paternal Grandmother     No current outpatient medications on file.   Physical exam:  Vitals:   08/23/19 0937  BP: 106/84  Pulse: 80  Temp: (!) 96.9 F (36.1 C)  TempSrc: Tympanic  Weight: 123 lb (55.8 kg)  Height: 5' 4"  (1.626 m)   Physical Exam HENT:     Head: Normocephalic and atraumatic.  Eyes:     Pupils: Pupils are equal, round, and reactive to light.  Cardiovascular:     Rate and Rhythm: Normal rate and regular rhythm.     Heart sounds: Normal heart sounds.  Pulmonary:     Effort: Pulmonary effort is normal.     Breath sounds: Normal breath sounds.  Abdominal:     General: Bowel sounds are normal.     Palpations: Abdomen is soft.  Musculoskeletal:     Cervical back: Normal range of motion.  Lymphadenopathy:     Comments: No palpable cervical, supraclavicular, axillary or inguinal adenopathy   Skin:    General: Skin is warm and dry.  Neurological:     Mental Status: She is alert and oriented to person, place, and time.        CMP Latest Ref Rng & Units 07/29/2019  Glucose 65 - 99 mg/dL 64(L)  BUN 6 - 20 mg/dL 11  Creatinine 0.57 - 1.00 mg/dL 0.70  Sodium 134 - 144 mmol/L 141  Potassium 3.5 - 5.2 mmol/L 3.7  Chloride 96 - 106 mmol/L 105  CO2 20 - 29 mmol/L 22  Calcium 8.7 - 10.2 mg/dL 9.4  Total Protein 6.0 - 8.5 g/dL 7.0  Total Bilirubin 0.0 - 1.2 mg/dL 0.4  Alkaline Phos 39 - 117 IU/L 87  AST 0 - 40 IU/L 22  ALT 0 - 32 IU/L 27   CBC Latest Ref Rng & Units 08/23/2019  WBC 4.0 - 10.5 K/uL 2.7(L)  Hemoglobin 12.0 - 15.0 g/dL 12.4  Hematocrit 36.0 - 46.0 % 39.1  Platelets 150 - 400 K/uL 181     Assessment and plan- Patient is a 24 y.o. female referred for leukopenia  Patient has isolated leukopenia/neutropenia in the  absence of other cytopenias.  I do not have any prior CBCs for comparison to know if her neutropenia has been chronic and ongoing for many years.  Given her African American ethnicity benign ethnic neutropenia is a possibility.  Today I will check a CBC with differential, smear review, B12 and folate as well as HIV and hepatitis C testing.  All these tests are unremarkable and inclined to monitor her CBC conservatively and keep an eye on her white cell count.  If her neutropenia worsens I will consider doing a bone marrow biopsy at that time.  Usually  benign ethnic neutropenia does not lead to severe neutropenia and it would be unusual to see an [place] of less than 500.  No clinical signs and symptoms suggestive of an autoimmune disorder.  Despite neutropenia patient has not had any symptoms of recurrent infections or hospitalizations.  I also do not see a role to initiate Neupogen at this time in the absence of infections.  Explained to the patient that there would be no role for any dietary modification at this time to improve her neutropenia.  I will see the patient back in 2 weeks time for a video visit to discuss the results of her blood work  Thank you for this kind referral and the opportunity to participate in the care of this patient   Visit Diagnosis 1. Neutropenia, unspecified type [facility])     Dr. Randa Evens, MD, MPH [facility] [phone] 08/24/2019  12:33 PM

## 2019-09-06 ENCOUNTER — Inpatient Hospital Stay: Payer: BC Managed Care – PPO | Attending: Oncology | Admitting: Oncology

## 2019-09-06 ENCOUNTER — Encounter: Payer: Self-pay | Admitting: Oncology

## 2019-09-06 DIAGNOSIS — D709 Neutropenia, unspecified: Secondary | ICD-10-CM | POA: Diagnosis not present

## 2019-09-06 NOTE — Progress Notes (Signed)
Patient is doing virtual visit, she is doing well no complaints or questions

## 2019-09-09 NOTE — Progress Notes (Signed)
I connected with Lowell Bouton on 09/09/19 at  2:15 PM EDT by video enabled telemedicine visit and verified that I am speaking with the correct person using two identifiers.   I discussed the limitations, risks, security and privacy concerns of performing an evaluation and management service by telemedicine and the availability of in-person appointments. I also discussed with the patient that there may be a patient responsible charge related to this service. The patient expressed understanding and agreed to proceed.  Other persons participating in the visit and their role in the encounter:  none  Patient's location:  home Provider's location:  work  Risk analyst Complaint: Discuss results of blood work  Diagnosis: Moderate neutropenia likely benign  History of present illness: Patient is a 24 year old African-American female referred to [place] for leukopenia.  Most recent CBC from 08/12/2019 showed white cell count of 2.6, H&H of 11.7/37 and platelet count of 186.  Differential showed ANC of 800.  Prior to that a month ago patient's white cell count was 2.4 with an [place] of 700.  I do not have any prior CBCs for comparison.  Patient feels well overall and has not had any significant fatigue, unintentional weight loss, drenching night sweats.  Denies any recurrent infections or hospitalizations.  Denies any recurrent infections growing up.  She is here with her father today who reports no family history of leukopenia.  Patient denies any over-the-counter medications or herbal supplements.  Denies any symptoms of skin rash joint pain or joint swelling.  No family history of any autoimmune disorders  Interval history: No changes since last visit.     Review of Systems  Constitutional: Negative for chills, fever, malaise/fatigue and weight loss.  HENT: Negative for congestion, ear discharge and nosebleeds.   Eyes: Negative for blurred vision.  Respiratory: Negative for cough, hemoptysis, sputum production,  shortness of breath and wheezing.   Cardiovascular: Negative for chest pain, palpitations, orthopnea and claudication.  Gastrointestinal: Negative for abdominal pain, blood in stool, constipation, diarrhea, heartburn, melena, nausea and vomiting.  Genitourinary: Negative for dysuria, flank pain, frequency, hematuria and urgency.  Musculoskeletal: Negative for back pain, joint pain and myalgias.  Skin: Negative for rash.  Neurological: Negative for dizziness, tingling, focal weakness, seizures, weakness and headaches.  Endo/Heme/Allergies: Does not bruise/bleed easily.  Psychiatric/Behavioral: Negative for depression and suicidal ideas. The patient does not have insomnia.     No Known Allergies  Past Medical History:  Diagnosis Date  . Neutropenia [facility])     Past Surgical History:  Procedure Laterality Date  . NO PAST SURGERIES      Social History   Socioeconomic History  . Marital status: Single    Spouse name: Not on file  . Number of children: Not on file  . Years of education: Not on file  . Highest education level: Not on file  Occupational History  . Not on file  Tobacco Use  . Smoking status: Never Smoker  . Smokeless tobacco: Never Used  Substance and Sexual Activity  . Alcohol use: No    Alcohol/week: 0.0 standard drinks  . Drug use: No  . Sexual activity: Not on file  Other Topics Concern  . Not on file  Social History Narrative  . Not on file   Social Determinants of Health   Financial Resource Strain:   . Difficulty of Paying Living Expenses:   Food Insecurity:   . Worried About Charity fundraiser in the Last Year:   . [employer] of  Food in the Last Year:   Transportation Needs:   . Film/video editor (Medical):   Marland Kitchen Lack of Transportation (Non-Medical):   Physical Activity:   . Days of Exercise per Week:   . Minutes of Exercise per Session:   Stress:   . Feeling of Stress :   Social Connections:   . Frequency of Communication with Friends and  Family:   . Frequency of Social Gatherings with Friends and Family:   . Attends Religious Services:   . Active Member of Clubs or Organizations:   . Attends Archivist Meetings:   Marland Kitchen Marital Status:   Intimate Partner Violence:   . Fear of Current or Ex-Partner:   . Emotionally Abused:   Marland Kitchen Physically Abused:   . Sexually Abused:     Family History  Problem Relation Age of Onset  . Healthy Mother   . Healthy Father   . Healthy Sister   . Healthy Brother   . Diabetes Maternal Grandmother   . Healthy Maternal Grandfather   . Healthy Paternal Grandmother     No current outpatient medications on file.  No results found.  No images are attached to the encounter.   CMP Latest Ref Rng & Units 07/29/2019  Glucose 65 - 99 mg/dL 64(L)  BUN 6 - 20 mg/dL 11  Creatinine 0.57 - 1.00 mg/dL 0.70  Sodium 134 - 144 mmol/L 141  Potassium 3.5 - 5.2 mmol/L 3.7  Chloride 96 - 106 mmol/L 105  CO2 20 - 29 mmol/L 22  Calcium 8.7 - 10.2 mg/dL 9.4  Total Protein 6.0 - 8.5 g/dL 7.0  Total Bilirubin 0.0 - 1.2 mg/dL 0.4  Alkaline Phos 39 - 117 IU/L 87  AST 0 - 40 IU/L 22  ALT 0 - 32 IU/L 27   CBC Latest Ref Rng & Units 08/23/2019  WBC 4.0 - 10.5 K/uL 2.7(L)  Hemoglobin 12.0 - 15.0 g/dL 12.4  Hematocrit 36.0 - 46.0 % 39.1  Platelets 150 - 400 K/uL 181     Observation/objective: Appears in no acute distress over video visit today.  Breathing is nonlabored  Assessment and plan: Patient is a 24 year old African-American female referred for neutropenia probably benign  Discussed results of blood work with the patient which shows No evidence of HIV or hepatitis C.  B12 and folate levels are normal.  Smear review was unremarkable.  Reticulocyte reactive lymphocytes were noted.  CBC with differential over the last 2 months shows isolated leukopenia with a white count has fluctuated between 2.4-2.7.  Globin and platelets are normal.  Absolute neutrophil count on 07/29/2019 was 700 followed by  [phone] and 600 on 08/23/2019.  Her neutrophil count is a little lower than would be expected for benign ethnic neutropenia but is still a possibility.  In the absence of other cytopenias I would like to hold off on a bone marrow biopsy at this time and continue to follow her trends monthly over the next 3 months  Follow-up instructions: I will see her back in 3 months time  I discussed the assessment and treatment plan with the patient. The patient was provided an opportunity to ask questions and all were answered. The patient agreed with the plan and demonstrated an understanding of the instructions.   The patient was advised to call back or seek an in-person evaluation if the symptoms worsen or if the condition fails to improve as anticipated.    Visit Diagnosis: 1. Neutropenia, unspecified type ([place])  Dr. Randa Evens, MD, MPH [place] [facility] Tel- [phone] 09/09/2019 5:55 AM

## 2019-09-15 ENCOUNTER — Telehealth: Payer: Self-pay

## 2019-09-15 NOTE — Telephone Encounter (Signed)
I have never treated patient for anxiety. I do not see it listed as a diagnosis in at least the past 4 years from her visits with Nadine Counts either. I have reviewed her medications and see no medications prescribed. I see no visits with mental health providers. I have also reviewed health data archive and I do not see any encounters for anxiety. At this point, I do not see that I or any other provider have treated her for significant or really any anxiety and I will decline to write the note for this purpose.

## 2019-09-15 NOTE — Telephone Encounter (Signed)
Patient informed and said oh ok.

## 2019-09-15 NOTE — Telephone Encounter (Signed)
Copied from CRM [id number]. Topic: General - Other >> Sep 15, 2019  9:49 AM Wyonia Hough E wrote: Reason for CRM: Pt needs a Dr note for jury duty for her anxiety/ please advise pt if she needs an appt or can pick up a note for it/ pt needs the Dr. Phoebe Sharps before May 1st /please advise >> Sep 15, 2019  9:58 AM Lynne Logan D wrote: Pt stated she actually needs letter before 09/29/19. Please advise.

## 2019-09-24 ENCOUNTER — Telehealth: Payer: Self-pay | Admitting: *Deleted

## 2019-09-24 NOTE — Telephone Encounter (Signed)
Pt has anxiety about being on jury duty, she has never been before and she does not know how the  jury duty works,. If she does get a letter then she has to have into the court system by April 28. I told pt that Smith Robert will get this info and make decision Monday and I will let her know. Pt agreeable to this.

## 2019-09-24 NOTE — Telephone Encounter (Signed)
Patient would like a letter excusing her from jury duty which is to begin 5/5. She has an appointment with Dr. Smith Robert on that day. She needs the letter by 5/28.

## 2019-09-27 ENCOUNTER — Encounter: Payer: Self-pay | Admitting: *Deleted

## 2019-09-27 NOTE — Telephone Encounter (Signed)
Per Dr. Smith Robert she does not feel comfortable giving a letter to not go to jury duty. Dr. Smith Robert says that she has neutropenia and she works and goes places so she could wear a mask and go to jury duty. The pt. Then tells me she wanted the letter because she has a lab appt that day she is suppose to be there and dr Smith Robert said to change her appt to another day. Pt agreed with appt 4/29

## 2019-09-30 ENCOUNTER — Inpatient Hospital Stay: Payer: BC Managed Care – PPO

## 2019-09-30 ENCOUNTER — Other Ambulatory Visit: Payer: Self-pay

## 2019-09-30 DIAGNOSIS — D709 Neutropenia, unspecified: Secondary | ICD-10-CM

## 2019-09-30 LAB — CBC WITH DIFFERENTIAL/PLATELET
Abs Immature Granulocytes: 0 10*3/uL (ref 0.00–0.07)
Basophils Absolute: 0 10*3/uL (ref 0.0–0.1)
Basophils Relative: 0 %
Eosinophils Absolute: 0.1 10*3/uL (ref 0.0–0.5)
Eosinophils Relative: 4 %
HCT: 36.4 % (ref 36.0–46.0)
Hemoglobin: 11.6 g/dL — ABNORMAL LOW (ref 12.0–15.0)
Immature Granulocytes: 0 %
Lymphocytes Relative: 57 %
Lymphs Abs: 1.6 10*3/uL (ref 0.7–4.0)
MCH: 26.6 pg (ref 26.0–34.0)
MCHC: 31.9 g/dL (ref 30.0–36.0)
MCV: 83.5 fL (ref 80.0–100.0)
Monocytes Absolute: 0.4 10*3/uL (ref 0.1–1.0)
Monocytes Relative: 13 %
Neutro Abs: 0.7 10*3/uL — ABNORMAL LOW (ref 1.7–7.7)
Neutrophils Relative %: 26 %
Platelets: 175 10*3/uL (ref 150–400)
RBC: 4.36 MIL/uL (ref 3.87–5.11)
RDW: 12.9 % (ref 11.5–15.5)
WBC: 2.8 10*3/uL — ABNORMAL LOW (ref 4.0–10.5)
nRBC: 0 % (ref 0.0–0.2)

## 2019-10-06 ENCOUNTER — Other Ambulatory Visit: Payer: BC Managed Care – PPO

## 2019-10-20 ENCOUNTER — Other Ambulatory Visit: Payer: Self-pay

## 2019-10-20 ENCOUNTER — Ambulatory Visit (INDEPENDENT_AMBULATORY_CARE_PROVIDER_SITE_OTHER): Payer: BC Managed Care – PPO | Admitting: Physician Assistant

## 2019-10-20 ENCOUNTER — Encounter: Payer: Self-pay | Admitting: Physician Assistant

## 2019-10-20 VITALS — Temp 97.2°F

## 2019-10-20 DIAGNOSIS — Z3042 Encounter for surveillance of injectable contraceptive: Secondary | ICD-10-CM | POA: Diagnosis not present

## 2019-10-20 DIAGNOSIS — Z23 Encounter for immunization: Secondary | ICD-10-CM | POA: Diagnosis not present

## 2019-10-20 MED ORDER — MEDROXYPROGESTERONE ACETATE 150 MG/ML IM SUSP
150.0000 mg | Freq: Once | INTRAMUSCULAR | Status: AC
  Administered 2019-10-20: 150 mg via INTRAMUSCULAR

## 2019-10-20 NOTE — Patient Instructions (Signed)
Immunization Schedule, 2-22 Years Old Vaccines are usually given at various ages, according to a schedule. Your health care provider will recommend vaccines for you based on your age, medical history, and lifestyle or other factors, such as travel or where you work. You may receive vaccines as individual doses or as more than one vaccine together in one shot (combination vaccine). Talk with your health care provider about the risks and benefits of combination vaccines. Before you get a vaccine: Talk with your health care provider about which vaccines are right for you. This is especially important if:  You previously had a reaction after getting a vaccine.  You have a weakened disease-fighting system (immune system). You may have a weakened immune system if you: ? Are taking medicines that reduce (suppress) the activity of your immune system. ? Are taking medicines to treat cancer (chemotherapy). ? Have HIV or AIDS.  You work in an environment where you may be exposed to a disease.  You plan to travel outside of the country.  You have a chronic illness, such as heart disease, kidney disease, diabetes, or lung disease. Recommended immunizations for 33-100 years old Influenza vaccine This may also be called the IIV, RIV, or LAIV vaccine.  You should get a dose of the influenza vaccine every year. Tetanus, diphtheria, and pertussis vaccine This is also known as the Tdap vaccine. You should get 1 dose of the Tdap vaccine if:  You have not previously gotten a Tdap vaccine.  You do not know if you have ever gotten a Tdap vaccine. Women should get 1 dose of the Tdap vaccine during each pregnancy. It is recommended that pregnant women receive this vaccine between 27 and 36 weeks of pregnancy. Tetanus, diphtheria vaccine This is also known as the Td vaccine.  You should get a dose of the Td vaccine every 10 years after 1 dose of Tdap vaccine. Measles, mumps, and rubella vaccine This is  also known as the MMR vaccine. You may need to get the MMR vaccine if:  You need to catch up on doses you missed in the past.  You have not been given the vaccine before.  You do not have evidence of immunity (by a blood test). Pregnant women should not get the MMR vaccine during pregnancy because it may be harmful to the unborn baby. However, if you are not immune to measles, mumps, or rubella, you should get a dose of MMR vaccine within days after delivery. Varicella vaccine This is also known as the VAR vaccine. You may need to get the VAR vaccine if:  You need to catch up on doses you missed in the past.  You have not been given the vaccine before.  You do not have evidence of immunity (by a blood test).  You have certain high-risk conditions, such as HIV or AIDS. Pregnant women should not get the VAR vaccine during pregnancy because it may be harmful to the unborn baby. However, if you are not immune to chickenpox (varicella), you should get a dose of the VAR vaccine within days after delivery. Human papillomavirus vaccine This is also known as the HPV vaccine. You should get the HPV vaccine if:  You have not gotten the vaccine before. The vaccine is recommended for all adults through age 24.  You need to catch up on doses you missed in the past. Pneumococcal conjugate vaccine This is also known as the PCV13 vaccine. You should get the PCV13 vaccine as recommended if you  have certain high-risk conditions. These include:  Diabetes.  Chronic conditions of the heart, lungs, or liver.  Conditions that affect the immune system. Pneumococcal polysaccharide vaccine This is also known as the PPSV23 vaccine. You should get the PPSV23 vaccine as recommended if you have certain high-risk conditions. These include:  Diabetes.  Chronic conditions of the heart, lungs, or liver.  Conditions that affect the immune system. Hepatitis A vaccine This is also known as the HepA  vaccine. If you did not get the HepA vaccine previously, you should get it if:  You are at risk for a hepatitis A infection. You may be at risk for infection if you: ? Have chronic liver disease. ? Have HIV or AIDS. ? Are a man who has sex with men. ? Use illegal drugs. ? Are homeless. ? May be exposed to hepatitis A through work. ? Travel to countries where hepatitis A is common. ? Are pregnant. ? Have or will have close contact with someone who was adopted from another country.  You are not at risk for infection but want protection from hepatitis A. Hepatitis B vaccine This is also known as the HepB vaccine. If you did not get the HepB vaccine previously, you should get it if:  You are at risk for hepatitis B infection. You are at risk if you: ? Have chronic liver disease. ? Have HIV or AIDS. ? Have sex with a partner who has hepatitis B, or:  You have multiple sex partners.  You are a man who has sex with men. ? Use illegal drugs. ? May be exposed to hepatitis B through work. ? Live with someone who has hepatitis B. ? Receive dialysis treatment. ? Have diabetes mellitus. ? Travel to countries where hepatitis B is common. ? Are pregnant.  You are not at risk of infection but want protection from hepatitis B. Meningococcal conjugate vaccine This is also known as the MenACWY vaccine. You may need to get the MenACWY vaccine if you:  Have not been given the vaccine before.  Need to catch up on doses you missed in the past. This vaccine is especially important if you:  Do not have a spleen.  Have sickle cell disease.  Have HIV.  Take medicines that suppress your immune system.  Travel to countries where meningococcal disease is common.  Are exposed to Neisseria meningitidis at work. Serogroup B meningococcal vaccine This is also known as the MenB vaccine. You may need to get the MenB vaccine if you:  Have not been given the vaccine before.  Need to catch up  on doses you missed in the past. This vaccine is especially important if you:  Do not have a spleen.  Have sickle cell disease.  Take medicines that suppress your immune system.  Are exposed to Neisseria meningitidis at work. Haemophilus influenzae type b vaccine This is also known as the Hib vaccine. Anyone older than 22 years of age is usually not given the Hib vaccine. However, if you have certain high-risk conditions, you may need to get this vaccine. These conditions include:  Not having a spleen.  Having received a stem cell transplant. Summary  Before you get a vaccine, tell your health care provider if you have reacted to vaccines in the past or have a condition that weakens your immune system.  At 19-21 years, you should get a dose of the flu vaccine every year and a dose of the Td vaccine if 10 years have passed since  you had a Tdap vaccine.  Depending on your medical history and your risk factors, you may also need other vaccines. Ask your health care provider whether you are up to date on all your vaccines.  Women who are pregnant may not receive certain vaccines. Ask your health care provider whether you should receive any vaccines soon after you deliver your baby. This information is not intended to replace advice given to you by your health care provider. Make sure you discuss any questions you have with your health care provider. Document Revised: 09/10/2018 Document Reviewed: 08/07/2018 Elsevier Patient Education  [place].

## 2019-10-20 NOTE — Progress Notes (Signed)
     Established patient visit   Patient: Erica Johns   DOB: [birth year]   24 y.o. Female  MRN: [id number] Visit Date: 10/20/2019  Today's healthcare provider: Trey Sailors, PA-C   No chief complaint on file.  Subjective    HPI  Encounter for Depo-Provera Contraception Patient presents today for depo-provera injection. Patient last injection was on 07/29/2019. Injection was given and patient tolerated injection well.  Patent tolerated injection well. Tdap given and Depo provera. She is to return between August 4-18.     Medications: No outpatient medications prior to visit.   No facility-administered medications prior to visit.    Review of Systems  Constitutional: Negative.   Respiratory: Negative.   Cardiovascular: Negative.   Hematological: Negative.       Objective    Temp (!) 97.2 F (36.2 C) (Temporal)    Physical Exam    No results found for any visits on 10/20/19.  Assessment & Plan    1. Encounter for Depo-Provera contraception  - medroxyPROGESTERone (DEPO-PROVERA) injection 150 mg  2. Need for diphtheria-tetanus-pertussis (Tdap) vaccine  - Tdap vaccine greater than or equal to 7yo IM  Patient here for depo injection and Tdap vaccination only.  I did not examine the patient.  I did review his medical history, medications, and allergies and vaccine consent form.  CMA gave vaccination. Patient tolerated well.  Maryella Shivers, [facility] 10/20/2019 9:34 AM        I, Trey Sailors, PA-C, have reviewed all documentation for this visit. The documentation on 10/20/19 for the exam, diagnosis, procedures, and orders are all accurate and complete.    Maryella Shivers  [facility] [phone] (phone) [phone] (fax)  [facility]

## 2019-11-05 ENCOUNTER — Inpatient Hospital Stay: Payer: BC Managed Care – PPO | Attending: Oncology

## 2019-12-09 ENCOUNTER — Encounter: Payer: Self-pay | Admitting: Oncology

## 2019-12-09 NOTE — Progress Notes (Signed)
Patient called for oncology follow-up appointment, expresses concerns of breast soreness.

## 2019-12-10 ENCOUNTER — Other Ambulatory Visit: Payer: Self-pay

## 2019-12-10 ENCOUNTER — Inpatient Hospital Stay (HOSPITAL_BASED_OUTPATIENT_CLINIC_OR_DEPARTMENT_OTHER): Payer: BC Managed Care – PPO | Admitting: Oncology

## 2019-12-10 ENCOUNTER — Inpatient Hospital Stay: Payer: BC Managed Care – PPO | Attending: Oncology

## 2019-12-10 VITALS — BP 108/77 | HR 68 | Temp 98.9°F | Resp 16 | Wt 130.6 lb

## 2019-12-10 DIAGNOSIS — D709 Neutropenia, unspecified: Secondary | ICD-10-CM

## 2019-12-10 LAB — CBC WITH DIFFERENTIAL/PLATELET
Abs Immature Granulocytes: 0 10*3/uL (ref 0.00–0.07)
Basophils Absolute: 0 10*3/uL (ref 0.0–0.1)
Basophils Relative: 1 %
Eosinophils Absolute: 0.1 10*3/uL (ref 0.0–0.5)
Eosinophils Relative: 5 %
HCT: 37 % (ref 36.0–46.0)
Hemoglobin: 12.1 g/dL (ref 12.0–15.0)
Immature Granulocytes: 0 %
Lymphocytes Relative: 53 %
Lymphs Abs: 1.4 10*3/uL (ref 0.7–4.0)
MCH: 26.8 pg (ref 26.0–34.0)
MCHC: 32.7 g/dL (ref 30.0–36.0)
MCV: 81.9 fL (ref 80.0–100.0)
Monocytes Absolute: 0.3 10*3/uL (ref 0.1–1.0)
Monocytes Relative: 13 %
Neutro Abs: 0.8 10*3/uL — ABNORMAL LOW (ref 1.7–7.7)
Neutrophils Relative %: 28 %
Platelets: 179 10*3/uL (ref 150–400)
RBC: 4.52 MIL/uL (ref 3.87–5.11)
RDW: 12.6 % (ref 11.5–15.5)
WBC: 2.6 10*3/uL — ABNORMAL LOW (ref 4.0–10.5)
nRBC: 0 % (ref 0.0–0.2)

## 2019-12-10 NOTE — Progress Notes (Signed)
Hematology/Oncology Consult note [facility]  Telephone:([phone] [phone]  Patient Care Team: Maryella Shivers as PCP - General (Physician Assistant)   Name of the patient: Erica Johns  [id number]  10/18/1995   Date of visit: 12/10/19  Diagnosis-chronic neutropenia likely benign ethnic neutropenia  Chief complaint/ Reason for visit-routine follow-up of neutropenia  Heme/Onc history: Patient is a 24 year old African-American female referred to [place] for leukopenia. Most recent CBC from 08/12/2019 showed white cell count of 2.6, H&H of 11.7/37 and platelet count of 186. Differential showed ANC of 800. Prior to that a month ago patient's white cell count was 2.4 with an ANC of 700. I do not have any prior CBCs for comparison. Patient feels well overall and has not had any significant fatigue, unintentional weight loss, drenching night sweats. Denies any recurrent infections or hospitalizations. Denies any recurrent infections growing up. She is here with her father today who reports no family history of leukopenia. Patient denies any over-the-counter medications or herbal supplements.Denies any symptoms of skin rash joint pain or joint swelling. No family history of any autoimmune disorders  ANC since February 2021 fluctuates between 600-800.  No other cytopenias.  HIV, hepatitis testing, B12 and folate levels normal   Interval history-patient feels well and denies any complaints at this time.  No history of any recurrent infections or hospitalizations.  Appetite and weight have remained stable.  ECOG PS- 0 Pain scale- 0   Review of systems- Review of Systems  Constitutional: Negative for chills, fever, malaise/fatigue and weight loss.  HENT: Negative for congestion, ear discharge and nosebleeds.   Eyes: Negative for blurred vision.  Respiratory: Negative for cough, hemoptysis, sputum production, shortness of breath and  wheezing.   Cardiovascular: Negative for chest pain, palpitations, orthopnea and claudication.  Gastrointestinal: Negative for abdominal pain, blood in stool, constipation, diarrhea, heartburn, melena, nausea and vomiting.  Genitourinary: Negative for dysuria, flank pain, frequency, hematuria and urgency.  Musculoskeletal: Negative for back pain, joint pain and myalgias.  Skin: Negative for rash.  Neurological: Negative for dizziness, tingling, focal weakness, seizures, weakness and headaches.  Endo/Heme/Allergies: Does not bruise/bleed easily.  Psychiatric/Behavioral: Negative for depression and suicidal ideas. The patient does not have insomnia.      No Known Allergies   Past Medical History:  Diagnosis Date  . Neutropenia [facility])      Past Surgical History:  Procedure Laterality Date  . NO PAST SURGERIES      Social History   Socioeconomic History  . Marital status: Single    Spouse name: Not on file  . Number of children: Not on file  . Years of education: Not on file  . Highest education level: Not on file  Occupational History  . Not on file  Tobacco Use  . Smoking status: Never Smoker  . Smokeless tobacco: Never Used  Substance and Sexual Activity  . Alcohol use: No    Alcohol/week: 0.0 standard drinks  . Drug use: No  . Sexual activity: Not on file  Other Topics Concern  . Not on file  Social History Narrative  . Not on file   Social Determinants of Health   Financial Resource Strain:   . Difficulty of Paying Living Expenses:   Food Insecurity:   . Worried About Programme researcher, broadcasting/film/video in the Last Year:   . Barista in the Last Year:   Transportation Needs:   . Freight forwarder (Medical):   Marland Kitchen  Lack of Transportation (Non-Medical):   Physical Activity:   . Days of Exercise per Week:   . Minutes of Exercise per Session:   Stress:   . Feeling of Stress :   Social Connections:   . Frequency of Communication with Friends and Family:   .  Frequency of Social Gatherings with Friends and Family:   . Attends Religious Services:   . Active Member of Clubs or Organizations:   . Attends Banker Meetings:   Marland Kitchen Marital Status:   Intimate Partner Violence:   . Fear of Current or Ex-Partner:   . Emotionally Abused:   Marland Kitchen Physically Abused:   . Sexually Abused:     Family History  Problem Relation Age of Onset  . Healthy Mother   . Healthy Father   . Healthy Sister   . Healthy Brother   . Diabetes Maternal Grandmother   . Healthy Maternal Grandfather   . Healthy Paternal Grandmother      Current Outpatient Medications:  .  estradiol cypionate (DEPO-ESTRADIOL) 5 MG/ML injection, Inject into the muscle every 28 (twenty-eight) days., Disp: , Rfl:   Physical exam:  Vitals:   12/10/19 1032  BP: 108/77  Pulse: 68  Resp: 16  Temp: 98.9 F (37.2 C)  TempSrc: Oral  SpO2: 100%  Weight: 130 lb 9.6 oz (59.2 kg)   Physical Exam Constitutional:      General: She is not in acute distress. Eyes:     Pupils: Pupils are equal, round, and reactive to light.  Cardiovascular:     Rate and Rhythm: Normal rate and regular rhythm.     Heart sounds: Normal heart sounds.  Pulmonary:     Effort: Pulmonary effort is normal.     Breath sounds: Normal breath sounds.  Abdominal:     General: Bowel sounds are normal. There is no distension.     Palpations: Abdomen is soft.     Tenderness: There is no abdominal tenderness.     Comments: No palpable inguinal adenopathy or splenomegaly.  Musculoskeletal:     Cervical back: Normal range of motion.  Skin:    General: Skin is warm and dry.  Neurological:     Mental Status: She is alert and oriented to person, place, and time.      CMP Latest Ref Rng & Units 07/29/2019  Glucose 65 - 99 mg/dL 16(W)  BUN 6 - 20 mg/dL 11  Creatinine 6.33 - 8.97 mg/dL 7.29  Sodium 251 - 836 mmol/L 141  Potassium 3.5 - 5.2 mmol/L 3.7  Chloride 96 - 106 mmol/L 105  CO2 20 - 29 mmol/L 22    Calcium 8.7 - 10.2 mg/dL 9.4  Total Protein 6.0 - 8.5 g/dL 7.0  Total Bilirubin 0.0 - 1.2 mg/dL 0.4  Alkaline Phos 39 - 117 IU/L 87  AST 0 - 40 IU/L 22  ALT 0 - 32 IU/L 27   CBC Latest Ref Rng & Units 09/30/2019  WBC 4.0 - 10.5 K/uL 2.8(L)  Hemoglobin 12.0 - 15.0 g/dL 11.6(L)  Hematocrit 36 - 46 % 36.4  Platelets 150 - 400 K/uL 175      Assessment and plan- Patient is a 24 y.o. female referred for isolated neutropenia likely benign ethnic neutropenia  Patient's white cell count has been fluctuating between 2.6-2.8 since February 2021.  ANC is between 600-800.  Hemoglobin and platelets are normal.  Given the stability of her neutrophil count I am inclined to hold off on getting a bone  marrow biopsy at this time.  Suspect neutropenia is due to benign ethnic cause.  Repeat CBC with differential in 3 months in 6 months and I will see her back in 6 months for a video visit   Visit Diagnosis 1. Neutropenia, unspecified type [facility])      Dr. Randa Evens, MD, MPH [facility] [phone] 12/10/2019 10:23 AM

## 2020-01-04 NOTE — Progress Notes (Signed)
Established patient visit   Patient: Erica Johns   DOB: 1995-11-27   24 y.o. Female  MRN: [id number] Visit Date: 01/05/2020  Today's healthcare provider: Trinna Post, PA-C   Chief Complaint  Patient presents with  . Encounter for Depo-Provera contraception  . Neck Pain  I,Shaneika M Angela Platner,acting as a scribe for MAKENLEY SHIMP, PA-C.,have documented all relevant documentation on the behalf of ADRIEL DESROSIER, PA-C,as directed by  Trinna Post, PA-C while in the presence of Trinna Post, PA-C.  Subjective    HPI   Encounter for Depo-Provera Contraception Patient presents today for depo-provera injection to left ven. Patient last injection was on 10/20/2019. Injection was given and patient tolerated injection well. Patient is due back between Oct 20-Nov 3.  Lymphadenopathy  Patient with a history of neutropenia presenting today for left neck mass. She reports it has been here for over a year and is recently getting bigger. She denies fevers, chills, weight loss, SOB, exposure to TB, sore throat, night sweats. She was seen on 12/10/2019 by [facility] hematology for neutropenia which was suspected to be benign ethnic neutropenia. Bone marrow biopsy was deferred and was to have a follow up visit in 6 months.      Medications: Outpatient Medications Prior to Visit  Medication Sig  . estradiol cypionate (DEPO-ESTRADIOL) 5 MG/ML injection Inject into the muscle every 28 (twenty-eight) days.   No facility-administered medications prior to visit.    Review of Systems    Objective    Temp 98.8 F (37.1 C) (Oral)    Physical Exam Constitutional:      Appearance: Normal appearance.  Neck:     Comments: Single enlarged non tender left sided cervical lymph node.  Cardiovascular:     Rate and Rhythm: Normal rate.  Pulmonary:     Effort: Pulmonary effort is normal.  Lymphadenopathy:     Cervical: Cervical adenopathy present.     Right cervical: No superficial,  deep or posterior cervical adenopathy.    Left cervical: Superficial cervical adenopathy present. No deep or posterior cervical adenopathy.  Skin:    General: Skin is warm and dry.  Neurological:     Mental Status: She is alert and oriented to person, place, and time. Mental status is at baseline.  Psychiatric:        Mood and Affect: Mood normal.        Behavior: Behavior normal.       No results found for any visits on 01/05/20.  Assessment & Plan    1. Lymphadenopathy of head and neck  Will get ultrasound as below due to chronicity. She has had negative HIV testing and stable CBC with hematology in 12/2019.   Ultrasound of neck showed multiple enlarged cervical lymph nodes, largest measuring 8 mm. I have consulted with Dr. Rogue Bussing from [facility] as her primary oncologist Dr. Janese Banks is on vacation currently. She is currently being monitored for neutropenia which was initially favored to be benign ethnic neutropenia. He recommended a STAT CT Chest and neck which I have ordered. These results will be forwarded Dr. Janese Banks when they are complete and she will be scheduled for a sooner appointment.   - US SOFT TISSUE NECK; Future  2. Encounter for Depo-Provera contraception  - medroxyPROGESTERone (DEPO-PROVERA) injection 150 mg    Return if symptoms worsen or fail to improve.      ITrinna Post, PA-C, have reviewed all documentation for this visit.  The documentation on 01/05/20 for the exam, diagnosis, procedures, and orders are all accurate and complete.   Paulene Floor  [facility] [phone] (phone) [phone] (fax)  [place]

## 2020-01-05 ENCOUNTER — Other Ambulatory Visit: Payer: Self-pay

## 2020-01-05 ENCOUNTER — Encounter: Payer: Self-pay | Admitting: Physician Assistant

## 2020-01-05 ENCOUNTER — Ambulatory Visit (INDEPENDENT_AMBULATORY_CARE_PROVIDER_SITE_OTHER): Payer: BC Managed Care – PPO | Admitting: Physician Assistant

## 2020-01-05 VITALS — BP 108/78 | HR 76 | Temp 98.8°F | Wt 127.2 lb

## 2020-01-05 DIAGNOSIS — R591 Generalized enlarged lymph nodes: Secondary | ICD-10-CM | POA: Diagnosis not present

## 2020-01-05 DIAGNOSIS — Z3042 Encounter for surveillance of injectable contraceptive: Secondary | ICD-10-CM | POA: Diagnosis not present

## 2020-01-05 MED ORDER — MEDROXYPROGESTERONE ACETATE 150 MG/ML IM SUSP
150.0000 mg | Freq: Once | INTRAMUSCULAR | Status: AC
Start: 2020-01-05 — End: 2020-01-05
  Administered 2020-01-05: 150 mg via INTRAMUSCULAR

## 2020-01-07 ENCOUNTER — Telehealth: Payer: Self-pay | Admitting: Physician Assistant

## 2020-01-07 ENCOUNTER — Other Ambulatory Visit: Payer: Self-pay

## 2020-01-07 ENCOUNTER — Ambulatory Visit
Admission: RE | Admit: 2020-01-07 | Discharge: 2020-01-07 | Disposition: A | Discharge status: Home / Self Care | Payer: BC Managed Care – PPO | Source: Ambulatory Visit | Attending: Physician Assistant | Admitting: Physician Assistant

## 2020-01-07 DIAGNOSIS — D709 Neutropenia, unspecified: Secondary | ICD-10-CM

## 2020-01-07 DIAGNOSIS — K1120 Sialoadenitis, unspecified: Secondary | ICD-10-CM | POA: Diagnosis not present

## 2020-01-07 DIAGNOSIS — R590 Localized enlarged lymph nodes: Secondary | ICD-10-CM

## 2020-01-07 DIAGNOSIS — R591 Generalized enlarged lymph nodes: Secondary | ICD-10-CM | POA: Diagnosis not present

## 2020-01-07 NOTE — Telephone Encounter (Signed)
Patient with history of neutropenia presented with cervical lymphadenopathy x 1 year last week in clinic and ultrasound demonstrated multiple enlarged lymph nodes.   Her primary hematologist is Dr. Smith Robert, currently on vacation, so I reached out to Dr. Donneta Romberg who recommended CT Neck and Chest. I have ordered these. [facility] will reach out to schedule patient sooner than her original 06/2020 follow up.   I will also refer to ENT for biopsy of the lymph nodes.   I have personally called the patient and explained this to her.

## 2020-01-12 ENCOUNTER — Ambulatory Visit: Payer: BC Managed Care – PPO | Attending: Physician Assistant

## 2020-01-18 DIAGNOSIS — R221 Localized swelling, mass and lump, neck: Secondary | ICD-10-CM | POA: Diagnosis not present

## 2020-02-03 ENCOUNTER — Encounter: Payer: Self-pay | Admitting: Physician Assistant

## 2020-03-10 ENCOUNTER — Inpatient Hospital Stay: Payer: BC Managed Care – PPO | Attending: Oncology

## 2020-03-13 ENCOUNTER — Telehealth: Payer: Self-pay | Admitting: Physician Assistant

## 2020-03-13 NOTE — Telephone Encounter (Signed)
Pt was a no show for CT of chest and CT of soft tissue that was ordered in August. She has not rescheduled

## 2020-03-13 NOTE — Telephone Encounter (Signed)
FYI. Thanks.

## 2020-04-05 ENCOUNTER — Other Ambulatory Visit: Payer: Self-pay

## 2020-04-05 ENCOUNTER — Ambulatory Visit (INDEPENDENT_AMBULATORY_CARE_PROVIDER_SITE_OTHER): Payer: BC Managed Care – PPO | Admitting: Physician Assistant

## 2020-04-05 DIAGNOSIS — Z3042 Encounter for surveillance of injectable contraceptive: Secondary | ICD-10-CM | POA: Diagnosis not present

## 2020-04-05 MED ORDER — MEDROXYPROGESTERONE ACETATE 150 MG/ML IM SUSP
150.0000 mg | Freq: Once | INTRAMUSCULAR | Status: AC
  Administered 2020-04-05: 150 mg via INTRAMUSCULAR

## 2020-04-05 NOTE — Progress Notes (Signed)
     Established patient visit   Patient: Erica Johns   DOB: November 01, 1995   24 y.o. Female  MRN: [id number] Visit Date: 04/05/2020  Today's healthcare provider: Trey Sailors, PA-C   No chief complaint on file.  Subjective    HPI   Encounter for Depo-Provera Contraception Patient presents today for depo-provera injection to right ventrogluteal. Patient last injection was on8/09/2019.Injection was given and patient tolerated injection well. Patient is due back between Jan. 19- Feb.2,2022.    Medications: Outpatient Medications Prior to Visit  Medication Sig  . estradiol cypionate (DEPO-ESTRADIOL) 5 MG/ML injection Inject into the muscle every 28 (twenty-eight) days.   No facility-administered medications prior to visit.    Review of Systems    Objective    There were no vitals taken for this visit.   Physical Exam    No results found for any visits on 04/05/20.  Assessment & Plan     Patient here for Depo shot only.  I did not examine the patient.  I did review his medical history, medications, and allergies and vaccine consent form.  CMA gave vaccination. Patient tolerated well.  Maryella Shivers,  [facility] 04/12/2020 1:51 PM    No follow-ups on file.        Maryella Shivers  [facility] [phone] (phone) [phone] (fax)  [facility]

## 2020-06-13 ENCOUNTER — Inpatient Hospital Stay: Payer: BC Managed Care – PPO

## 2020-06-14 ENCOUNTER — Inpatient Hospital Stay: Payer: BC Managed Care – PPO | Attending: Oncology

## 2020-06-15 ENCOUNTER — Encounter: Payer: Self-pay | Admitting: Oncology

## 2020-06-15 ENCOUNTER — Other Ambulatory Visit: Payer: Self-pay

## 2020-06-15 ENCOUNTER — Inpatient Hospital Stay (HOSPITAL_BASED_OUTPATIENT_CLINIC_OR_DEPARTMENT_OTHER): Payer: BC Managed Care – PPO | Admitting: Oncology

## 2020-06-15 DIAGNOSIS — D709 Neutropenia, unspecified: Secondary | ICD-10-CM

## 2020-06-15 NOTE — Progress Notes (Signed)
Patient has no concerns to discuss

## 2020-06-18 NOTE — Progress Notes (Signed)
I connected with Erica Johns on 06/18/20 at  2:15 PM EST by video enabled telemedicine visit and verified that I am speaking with the correct person using two identifiers.   I discussed the limitations, risks, security and privacy concerns of performing an evaluation and management service by telemedicine and the availability of in-person appointments. I also discussed with the patient that there may be a patient responsible charge related to this service. The patient expressed understanding and agreed to proceed.  Other persons participating in the visit and their role in the encounter:  none  Patient's location:  home Provider's location:  work  Risk analyst Complaint: Routine follow-up of neutropenia  History of present illness:  Patient is a 25 year old African-American female referred to [place] for leukopenia. Most recent CBC from 08/12/2019 showed white cell count of 2.6, H&H of 11.7/37 and platelet count of 186. Differential showed ANC of 800. Prior to that a month ago patient's white cell count was 2.4 with an [place] of 700. I do not have any prior CBCs for comparison. Patient feels well overall and has not had any significant fatigue, unintentional weight loss, drenching night sweats. Denies any recurrent infections or hospitalizations. Denies any recurrent infections growing up. She is here with her father today who reports no family history of leukopenia. Patient denies any over-the-counter medications or herbal supplements.Denies any symptoms of skin rash joint pain or joint swelling. No family history of any autoimmune disorders  ANC since February 2021 fluctuates between 600-800.  No other cytopenias.  HIV, hepatitis testing, B12 and folate levels normal  Interval history : Patient reports feeling well denies any complaints at this time.  Denies any recent hospitalizations or infections   Review of Systems  Constitutional: Negative for chills, fever, malaise/fatigue and weight  loss.  HENT: Negative for congestion, ear discharge and nosebleeds.   Eyes: Negative for blurred vision.  Respiratory: Negative for cough, hemoptysis, sputum production, shortness of breath and wheezing.   Cardiovascular: Negative for chest pain, palpitations, orthopnea and claudication.  Gastrointestinal: Negative for abdominal pain, blood in stool, constipation, diarrhea, heartburn, melena, nausea and vomiting.  Genitourinary: Negative for dysuria, flank pain, frequency, hematuria and urgency.  Musculoskeletal: Negative for back pain, joint pain and myalgias.  Skin: Negative for rash.  Neurological: Negative for dizziness, tingling, focal weakness, seizures, weakness and headaches.  Endo/Heme/Allergies: Does not bruise/bleed easily.  Psychiatric/Behavioral: Negative for depression and suicidal ideas. The patient does not have insomnia.     No Known Allergies  Past Medical History:  Diagnosis Date  . Neutropenia [facility])     Past Surgical History:  Procedure Laterality Date  . NO PAST SURGERIES      Social History   Socioeconomic History  . Marital status: Single    Spouse name: Not on file  . Number of children: Not on file  . Years of education: Not on file  . Highest education level: Not on file  Occupational History  . Not on file  Tobacco Use  . Smoking status: Never Smoker  . Smokeless tobacco: Never Used  Vaping Use  . Vaping Use: Not on file  Substance and Sexual Activity  . Alcohol use: No    Alcohol/week: 0.0 standard drinks  . Drug use: No  . Sexual activity: Not on file  Other Topics Concern  . Not on file  Social History Narrative  . Not on file   Social Determinants of Health   Financial Resource Strain: Not on file  Food Insecurity: Not  on file  Transportation Needs: Not on file  Physical Activity: Not on file  Stress: Not on file  Social Connections: Not on file  Intimate Partner Violence: Not on file    Family History  Problem Relation Age  of Onset  . Healthy Mother   . Healthy Father   . Healthy Sister   . Healthy Brother   . Diabetes Maternal Grandmother   . Healthy Maternal Grandfather   . Healthy Paternal Grandmother      Current Outpatient Medications:  .  estradiol cypionate (DEPO-ESTRADIOL) 5 MG/ML injection, Inject into the muscle every 28 (twenty-eight) days., Disp: , Rfl:   No results found.  No images are attached to the encounter.   CMP Latest Ref Rng & Units 07/29/2019  Glucose 65 - 99 mg/dL 64(L)  BUN 6 - 20 mg/dL 11  Creatinine 0.57 - 1.00 mg/dL 0.70  Sodium 134 - 144 mmol/L 141  Potassium 3.5 - 5.2 mmol/L 3.7  Chloride 96 - 106 mmol/L 105  CO2 20 - 29 mmol/L 22  Calcium 8.7 - 10.2 mg/dL 9.4  Total Protein 6.0 - 8.5 g/dL 7.0  Total Bilirubin 0.0 - 1.2 mg/dL 0.4  Alkaline Phos 39 - 117 IU/L 87  AST 0 - 40 IU/L 22  ALT 0 - 32 IU/L 27   CBC Latest Ref Rng & Units 12/10/2019  WBC 4.0 - 10.5 K/uL 2.6(L)  Hemoglobin 12.0 - 15.0 g/dL 12.1  Hematocrit 36.0 - 46.0 % 37.0  Platelets 150 - 400 K/uL 179   Assessment and plan:Patient is a 25 year old female with chronic neutropenia likely benign ethnic   We have been monitoring patient's CBC since February 2021 and a white cell count fluctuates between 2.4-2.8 with an [place] between 0.6-0.8.  Hemoglobin and platelets are normal.  There has been no consistent downward trend in her neutropenia.  She is asymptomatic with no recurrent infections or hospitalizations.  This is likely benign ethnic neutropenia which can be monitored without the need for a bone marrow biopsy.  However if her neutropenia worsens I will consider doing that  Follow-up instructions: CBC with differential in 6 weeks in 4 months and I will see her back in 4 months  I discussed the assessment and treatment plan with the patient. The patient was provided an opportunity to ask questions and all were answered. The patient agreed with the plan and demonstrated an understanding of the  instructions.   The patient was advised to call back or seek an in-person evaluation if the symptoms worsen or if the condition fails to improve as anticipated.   Visit Diagnosis: 1. Neutropenia, unspecified type ([place])     Dr. Randa Evens, MD, MPH [facility] at [facility] Tel- [phone] 06/18/2020 7:56 PM

## 2020-06-21 ENCOUNTER — Ambulatory Visit (INDEPENDENT_AMBULATORY_CARE_PROVIDER_SITE_OTHER): Payer: BC Managed Care – PPO | Admitting: Physician Assistant

## 2020-06-21 ENCOUNTER — Other Ambulatory Visit: Payer: Self-pay

## 2020-06-21 VITALS — Temp 98.9°F

## 2020-06-21 DIAGNOSIS — Z3042 Encounter for surveillance of injectable contraceptive: Secondary | ICD-10-CM

## 2020-06-21 MED ORDER — MEDROXYPROGESTERONE ACETATE 150 MG/ML IM SUSP
150.0000 mg | Freq: Once | INTRAMUSCULAR | Status: AC
Start: 2020-06-21 — End: 2020-06-21
  Administered 2020-06-21: 150 mg via INTRAMUSCULAR

## 2020-06-21 NOTE — Progress Notes (Signed)
      Established patient visit   Patient: Erica Johns   DOB: February 05, 1996   25 y.o. Female  MRN: [id number] Visit Date: 06/21/2020  Today's healthcare provider: Trey Sailors, PA-C   Chief Complaint  Patient presents with  . Contraception  I,Milika M Ash[place] Wiseman,acting as a Neurosurgeon for [employer], PA-C.,have documented all relevant documentation on the behalf of YVONDA FOUTY, PA-C,as directed by  Trey Sailors, PA-C while in the presence of Trey Sailors, PA-C.  Subjective    HPI   Encounter for Depo-Provera Contraception Patient presents today for depo-provera injectionto left ventrogluteal. Patient last injection was on11/08/2019.Injection was given and patient tolerated injection well.Patient is due back between Apr 6-Apr 20,2022.     Medications: Outpatient Medications Prior to Visit  Medication Sig  . estradiol cypionate (DEPO-ESTRADIOL) 5 MG/ML injection Inject into the muscle every 28 (twenty-eight) days.   No facility-administered medications prior to visit.    Review of Systems     Objective    Temp 98.9 F (37.2 C) (Oral)     Physical Exam    No results found for any visits on 06/21/20.  Assessment & Plan    1. Encounter for Depo-Provera contraception  - medroxyPROGESTERone (DEPO-PROVERA) injection 150 mg   No follow-ups on file.      ITrey Sailors, PA-C, have reviewed all documentation for this visit. The documentation on 06/21/20 for the exam, diagnosis, procedures, and orders are all accurate and complete.  Patient here for depo injection only.  I did not examine the patient.  I did review his medical history, medications, and allergies and vaccine consent form.  CMA gave vaccination. Patient tolerated well.  Maryella Shivers [facility] 06/21/2020 1:36 PM     Ricki Rodriguez Kathleene Hazel  [facility] [phone] (phone) [phone] (fax)  [facility]

## 2020-07-27 ENCOUNTER — Inpatient Hospital Stay: Payer: BC Managed Care – PPO | Attending: Oncology

## 2020-09-06 ENCOUNTER — Ambulatory Visit: Payer: BC Managed Care – PPO | Admitting: Physician Assistant

## 2020-09-06 ENCOUNTER — Ambulatory Visit (INDEPENDENT_AMBULATORY_CARE_PROVIDER_SITE_OTHER): Payer: BC Managed Care – PPO | Admitting: Adult Health

## 2020-09-06 ENCOUNTER — Other Ambulatory Visit: Payer: Self-pay

## 2020-09-06 DIAGNOSIS — Z3042 Encounter for surveillance of injectable contraceptive: Secondary | ICD-10-CM

## 2020-09-06 MED ORDER — MEDROXYPROGESTERONE ACETATE 150 MG/ML IM SUSP
150.0000 mg | Freq: Once | INTRAMUSCULAR | Status: AC
  Administered 2020-09-06: 150 mg via INTRAMUSCULAR

## 2020-10-13 ENCOUNTER — Inpatient Hospital Stay: Payer: BC Managed Care – PPO | Attending: Oncology

## 2020-10-13 ENCOUNTER — Inpatient Hospital Stay (HOSPITAL_BASED_OUTPATIENT_CLINIC_OR_DEPARTMENT_OTHER): Payer: BC Managed Care – PPO | Admitting: Oncology

## 2020-10-13 ENCOUNTER — Other Ambulatory Visit: Payer: Self-pay

## 2020-10-13 ENCOUNTER — Encounter: Payer: Self-pay | Admitting: Oncology

## 2020-10-13 VITALS — BP 122/72 | HR 80 | Temp 97.5°F | Wt 132.1 lb

## 2020-10-13 DIAGNOSIS — D709 Neutropenia, unspecified: Secondary | ICD-10-CM | POA: Diagnosis not present

## 2020-10-13 LAB — CBC WITH DIFFERENTIAL/PLATELET
Abs Immature Granulocytes: 0.01 10*3/uL (ref 0.00–0.07)
Basophils Absolute: 0 10*3/uL (ref 0.0–0.1)
Basophils Relative: 0 %
Eosinophils Absolute: 0.1 10*3/uL (ref 0.0–0.5)
Eosinophils Relative: 3 %
HCT: 34.3 % — ABNORMAL LOW (ref 36.0–46.0)
Hemoglobin: 10.8 g/dL — ABNORMAL LOW (ref 12.0–15.0)
Immature Granulocytes: 0 %
Lymphocytes Relative: 59 %
Lymphs Abs: 1.9 10*3/uL (ref 0.7–4.0)
MCH: 26.7 pg (ref 26.0–34.0)
MCHC: 31.5 g/dL (ref 30.0–36.0)
MCV: 84.7 fL (ref 80.0–100.0)
Monocytes Absolute: 0.3 10*3/uL (ref 0.1–1.0)
Monocytes Relative: 9 %
Neutro Abs: 0.9 10*3/uL — ABNORMAL LOW (ref 1.7–7.7)
Neutrophils Relative %: 29 %
Platelets: 174 10*3/uL (ref 150–400)
RBC: 4.05 MIL/uL (ref 3.87–5.11)
RDW: 13 % (ref 11.5–15.5)
WBC: 3.2 10*3/uL — ABNORMAL LOW (ref 4.0–10.5)
nRBC: 0 % (ref 0.0–0.2)

## 2020-10-13 NOTE — Progress Notes (Signed)
Pt here for check up with neutropenia. She has no concerns.

## 2020-10-15 NOTE — Progress Notes (Signed)
Hematology/Oncology Consult note [facility]  [phone] [phone]  Patient Care Team: Paulene Floor as PCP - General (Physician Assistant)   Name of the patient: Erica Johns  [id number]  February 21, 1996   Date of visit: 10/15/20  Diagnosis-chronic benign neutropenia  Chief complaint/ Reason for visit-routine follow-up of neutropenia  Heme/Onc history: Patient is a 25 year old African-American female referred to [place] for leukopenia. Most recent CBC from 08/12/2019 showed white cell count of 2.6, H&H of 11.7/37 and platelet count of 186. Differential showed ANC of 800. Prior to that a month ago patient's white cell count was 2.4 with an [place] of 700. I do not have any prior CBCs for comparison. Patient feels well overall and has not had any significant fatigue, unintentional weight loss, drenching night sweats. Denies any recurrent infections or hospitalizations. Denies any recurrent infections growing up. She is here with her father today who reports no family history of leukopenia. Patient denies any over-the-counter medications or herbal supplements.Denies any symptoms of skin rash joint pain or joint swelling. No family history of any autoimmune disorders  ANC since February 2021 fluctuates between 600-800. No other cytopenias. HIV, hepatitis testing, B12 and folate levels normal  Interval history-patient reports doing well overall.  Denies any specific complaints at this time.  No recurrent infections or hospitalizations.  Appetite and weight have remained stable  ECOG PS- 0 Pain scale- 0   Review of systems- Review of Systems  Constitutional: Negative for chills, fever, malaise/fatigue and weight loss.  HENT: Negative for congestion, ear discharge and nosebleeds.   Eyes: Negative for blurred vision.  Respiratory: Negative for cough, hemoptysis, sputum production, shortness of breath and wheezing.    Cardiovascular: Negative for chest pain, palpitations, orthopnea and claudication.  Gastrointestinal: Negative for abdominal pain, blood in stool, constipation, diarrhea, heartburn, melena, nausea and vomiting.  Genitourinary: Negative for dysuria, flank pain, frequency, hematuria and urgency.  Musculoskeletal: Negative for back pain, joint pain and myalgias.  Skin: Negative for rash.  Neurological: Negative for dizziness, tingling, focal weakness, seizures, weakness and headaches.  Endo/Heme/Allergies: Does not bruise/bleed easily.  Psychiatric/Behavioral: Negative for depression and suicidal ideas. The patient does not have insomnia.      No Known Allergies   Past Medical History:  Diagnosis Date  . Neutropenia [facility])      Past Surgical History:  Procedure Laterality Date  . NO PAST SURGERIES      Social History   Socioeconomic History  . Marital status: Single    Spouse name: Not on file  . Number of children: Not on file  . Years of education: Not on file  . Highest education level: Not on file  Occupational History  . Not on file  Tobacco Use  . Smoking status: Never Smoker  . Smokeless tobacco: Never Used  Vaping Use  . Vaping Use: Never used  Substance and Sexual Activity  . Alcohol use: No    Alcohol/week: 0.0 standard drinks  . Drug use: No  . Sexual activity: Yes  Other Topics Concern  . Not on file  Social History Narrative  . Not on file   Social Determinants of Health   Financial Resource Strain: Not on file  Food Insecurity: Not on file  Transportation Needs: Not on file  Physical Activity: Not on file  Stress: Not on file  Social Connections: Not on file  Intimate Partner Violence: Not on file    Family History  Problem Relation Age  of Onset  . Healthy Mother   . Healthy Father   . Healthy Sister   . Healthy Brother   . Diabetes Maternal Grandmother   . Healthy Maternal Grandfather   . Healthy Paternal Grandmother      Current  Outpatient Medications:  .  estradiol cypionate (DEPO-ESTRADIOL) 5 MG/ML injection, Inject into the muscle every 3 (three) months., Disp: , Rfl:   Physical exam:  Vitals:   10/13/20 1318  BP: 122/72  Pulse: 80  Temp: (!) 97.5 F (36.4 C)  TempSrc: Tympanic  SpO2: 100%  Weight: 132 lb 1.6 oz (59.9 kg)   Physical Exam Constitutional:      General: She is not in acute distress. Cardiovascular:     Rate and Rhythm: Normal rate and regular rhythm.     Heart sounds: Normal heart sounds.  Pulmonary:     Effort: Pulmonary effort is normal.     Breath sounds: Normal breath sounds.  Skin:    General: Skin is warm and dry.  Neurological:     Mental Status: She is alert and oriented to person, place, and time.      CMP Latest Ref Rng & Units 07/29/2019  Glucose 65 - 99 mg/dL 64(L)  BUN 6 - 20 mg/dL 11  Creatinine 0.57 - 1.00 mg/dL 0.70  Sodium 134 - 144 mmol/L 141  Potassium 3.5 - 5.2 mmol/L 3.7  Chloride 96 - 106 mmol/L 105  CO2 20 - 29 mmol/L 22  Calcium 8.7 - 10.2 mg/dL 9.4  Total Protein 6.0 - 8.5 g/dL 7.0  Total Bilirubin 0.0 - 1.2 mg/dL 0.4  Alkaline Phos 39 - 117 IU/L 87  AST 0 - 40 IU/L 22  ALT 0 - 32 IU/L 27   CBC Latest Ref Rng & Units 10/13/2020  WBC 4.0 - 10.5 K/uL 3.2(L)  Hemoglobin 12.0 - 15.0 g/dL 10.8(L)  Hematocrit 36.0 - 46.0 % 34.3(L)  Platelets 150 - 400 K/uL 174     Assessment and plan- Patient is a 25 y.o. female with history of chronic neutropenia possibly benign ethnic neutropenia here for routine follow-up  Patient's white cell count has fluctuated between 2.5-3.5 since February 2021 and ANC fluctuates between 600-900 without a clear downward trend.  I suspect this is benign ethnic neutropenia.  At baseline her hemoglobin is between 11.5-12.5 but today it is lower at 10.8.  Platelet counts are normal.  I would like to check her CBC ferritin and iron studies and B12 in 4 months.  If there is worsening of her neutropenia and/or anemia bone marrow biopsy  can be considered   Visit Diagnosis 1. Neutropenia, unspecified type [facility])      Dr. Randa Evens, MD, MPH [facility] at [facility] [phone] 10/15/2020 12:23 PM

## 2020-10-23 ENCOUNTER — Ambulatory Visit: Payer: BC Managed Care – PPO | Admitting: Family Medicine

## 2020-10-23 NOTE — Progress Notes (Deleted)
Acute Office Visit  Subjective:    Patient ID: Erica Johns, female    DOB: 01-17-1996, 25 y.o.   MRN: [id number]  No chief complaint on file.   HPI Patient is in today for evaluation of what the patient believes to be nerve damage secondary to blood draw.  Past Medical History:  Diagnosis Date  . Neutropenia [facility])     Past Surgical History:  Procedure Laterality Date  . NO PAST SURGERIES      Family History  Problem Relation Age of Onset  . Healthy Mother   . Healthy Father   . Healthy Sister   . Healthy Brother   . Diabetes Maternal Grandmother   . Healthy Maternal Grandfather   . Healthy Paternal Grandmother     Social History   Socioeconomic History  . Marital status: Single    Spouse name: Not on file  . Number of children: Not on file  . Years of education: Not on file  . Highest education level: Not on file  Occupational History  . Not on file  Tobacco Use  . Smoking status: Never Smoker  . Smokeless tobacco: Never Used  Vaping Use  . Vaping Use: Never used  Substance and Sexual Activity  . Alcohol use: No    Alcohol/week: 0.0 standard drinks  . Drug use: No  . Sexual activity: Yes  Other Topics Concern  . Not on file  Social History Narrative  . Not on file   Social Determinants of Health   Financial Resource Strain: Not on file  Food Insecurity: Not on file  Transportation Needs: Not on file  Physical Activity: Not on file  Stress: Not on file  Social Connections: Not on file  Intimate Partner Violence: Not on file    Outpatient Medications Prior to Visit  Medication Sig Dispense Refill  . estradiol cypionate (DEPO-ESTRADIOL) 5 MG/ML injection Inject into the muscle every 3 (three) months.     No facility-administered medications prior to visit.    No Known Allergies  Review of Systems     Objective:    Physical Exam  There were no vitals taken for this visit. Wt Readings from Last 3 Encounters:  10/13/20 132 lb  1.6 oz (59.9 kg)  01/05/20 127 lb 3.2 oz (57.7 kg)  12/10/19 130 lb 9.6 oz (59.2 kg)    Health Maintenance Due  Topic Date Due  . COVID-19 Vaccine (1) Never done  . HPV VACCINES (2 - 3-dose series) 09/30/2014       Topic Date Due  . HPV VACCINES (2 - 3-dose series) 09/30/2014     Lab Results  Component Value Date   TSH 1.900 07/29/2019   Lab Results  Component Value Date   WBC 3.2 (L) 10/13/2020   HGB 10.8 (L) 10/13/2020   HCT 34.3 (L) 10/13/2020   MCV 84.7 10/13/2020   PLT 174 10/13/2020   Lab Results  Component Value Date   NA 141 07/29/2019   K 3.7 07/29/2019   CO2 22 07/29/2019   GLUCOSE 64 (L) 07/29/2019   BUN 11 07/29/2019   CREATININE 0.70 07/29/2019   BILITOT 0.4 07/29/2019   ALKPHOS 87 07/29/2019   AST 22 07/29/2019   ALT 27 07/29/2019   PROT 7.0 07/29/2019   ALBUMIN 4.6 07/29/2019   CALCIUM 9.4 07/29/2019   Lab Results  Component Value Date   CHOL 131 07/29/2019   Lab Results  Component Value Date   HDL 58 07/29/2019  Lab Results  Component Value Date   LDLCALC 59 07/29/2019   Lab Results  Component Value Date   TRIG 67 07/29/2019   Lab Results  Component Value Date   CHOLHDL 2.3 07/29/2019   No results found for: HGBA1C     Assessment & Plan:   Problem List Items Addressed This Visit   None      No orders of the defined types were placed in this encounter.    Adline Peals, CMA

## 2020-11-24 ENCOUNTER — Other Ambulatory Visit: Payer: Self-pay

## 2020-11-24 ENCOUNTER — Ambulatory Visit (INDEPENDENT_AMBULATORY_CARE_PROVIDER_SITE_OTHER): Payer: BC Managed Care – PPO | Admitting: Family Medicine

## 2020-11-24 ENCOUNTER — Encounter: Payer: Self-pay | Admitting: Family Medicine

## 2020-11-24 DIAGNOSIS — Z3042 Encounter for surveillance of injectable contraceptive: Secondary | ICD-10-CM | POA: Diagnosis not present

## 2020-11-24 MED ORDER — MEDROXYPROGESTERONE ACETATE 150 MG/ML IM SUSP
150.0000 mg | Freq: Once | INTRAMUSCULAR | Status: AC
Start: 2020-11-24 — End: 2020-11-24
  Administered 2020-11-24: 150 mg via INTRAMUSCULAR

## 2020-11-24 NOTE — Progress Notes (Signed)
      Established patient visit   Patient: Erica Johns   DOB: 05/19/1996   25 y.o. Female  MRN: [id number] Visit Date: 11/24/2020  Today's healthcare provider: Shirlee Latch, MD   Chief Complaint  Patient presents with   Contraception   Subjective    HPI  Patient here for depo injection. Last injection was done on 09/06/20.      Medications: Outpatient Medications Prior to Visit  Medication Sig   estradiol cypionate (DEPO-ESTRADIOL) 5 MG/ML injection Inject into the muscle every 3 (three) months.   No facility-administered medications prior to visit.   .Patient here for Depo-Provera only.  I did not examine the patient.  I did review her medical history, medications, and allergies and vaccine consent form.  CMA gave. Patient tolerated well.  Erasmo Downer, MD, MPH [facility] 11/24/2020 11:18 AM

## 2021-02-02 ENCOUNTER — Telehealth: Payer: Self-pay

## 2021-02-02 NOTE — Telephone Encounter (Signed)
Copied from CRM [id number]. Topic: Appointment Scheduling - Scheduling Inquiry for Clinic >> Feb 02, 2021 12:39 PM Glean Salen wrote: Reason for CVU:DTHYHOO called to reschedule nurse visit for 09/09. Please call back

## 2021-02-09 ENCOUNTER — Ambulatory Visit: Payer: BC Managed Care – PPO | Admitting: Family Medicine

## 2021-02-14 ENCOUNTER — Inpatient Hospital Stay: Payer: Medicaid Other | Attending: Oncology

## 2021-02-16 ENCOUNTER — Encounter: Payer: Self-pay | Admitting: Oncology

## 2021-02-16 ENCOUNTER — Inpatient Hospital Stay (HOSPITAL_BASED_OUTPATIENT_CLINIC_OR_DEPARTMENT_OTHER): Payer: Medicaid Other | Admitting: Oncology

## 2021-02-16 DIAGNOSIS — D709 Neutropenia, unspecified: Secondary | ICD-10-CM

## 2021-02-17 NOTE — Progress Notes (Signed)
I connected with Erica Johns on 02/17/21 at  2:45 PM EDT by video enabled telemedicine visit and verified that I am speaking with the correct person using two identifiers.   I discussed the limitations, risks, security and privacy concerns of performing an evaluation and management service by telemedicine and the availability of in-person appointments. I also discussed with the patient that there may be a patient responsible charge related to this service. The patient expressed understanding and agreed to proceed.  Other persons participating in the visit and their role in the encounter:  none  Patient's location:  home Provider's location:  work  Stage manager Complaint:  routine f/u of neutropenia  History of present illness: Patient is a 25 year old African-American female referred to [place] for leukopenia.  Most recent CBC from 08/12/2019 showed white cell count of 2.6, H&H of 11.7/37 and platelet count of 186.  Differential showed ANC of 800.  Prior to that a month ago patient's white cell count was 2.4 with an ANC of 700.  I do not have any prior CBCs for comparison.  Patient feels well overall and has not had any significant fatigue, unintentional weight loss, drenching night sweats.  Denies any recurrent infections or hospitalizations.  Denies any recurrent infections growing up.  She is here with her father today who reports no family history of leukopenia.  Patient denies any over-the-counter medications or herbal supplements.  Denies any symptoms of skin rash joint pain or joint swelling.  No family history of any autoimmune disorders   ANC since February 2021 fluctuates between 600-800.  No other cytopenias.  HIV, hepatitis testing, B12 and folate levels normal  Interval history    Review of Systems  Constitutional:  Negative for chills, fever, malaise/fatigue and weight loss.  HENT:  Negative for congestion, ear discharge and nosebleeds.   Eyes:  Negative for blurred vision.  Respiratory:   Negative for cough, hemoptysis, sputum production, shortness of breath and wheezing.   Cardiovascular:  Negative for chest pain, palpitations, orthopnea and claudication.  Gastrointestinal:  Negative for abdominal pain, blood in stool, constipation, diarrhea, heartburn, melena, nausea and vomiting.  Genitourinary:  Negative for dysuria, flank pain, frequency, hematuria and urgency.  Musculoskeletal:  Negative for back pain, joint pain and myalgias.  Skin:  Negative for rash.  Neurological:  Negative for dizziness, tingling, focal weakness, seizures, weakness and headaches.  Endo/Heme/Allergies:  Does not bruise/bleed easily.  Psychiatric/Behavioral:  Negative for depression and suicidal ideas. The patient does not have insomnia.    No Known Allergies  Past Medical History:  Diagnosis Date   Neutropenia (HCC)     Past Surgical History:  Procedure Laterality Date   NO PAST SURGERIES      Social History   Socioeconomic History   Marital status: Single    Spouse name: Not on file   Number of children: Not on file   Years of education: Not on file   Highest education level: Not on file  Occupational History   Not on file  Tobacco Use   Smoking status: Never   Smokeless tobacco: Never  Vaping Use   Vaping Use: Never used  Substance and Sexual Activity   Alcohol use: No    Alcohol/week: 0.0 standard drinks   Drug use: No   Sexual activity: Yes  Other Topics Concern   Not on file  Social History Narrative   Not on file   Social Determinants of Health   Financial Resource Strain: Not on file  Food  Insecurity: Not on file  Transportation Needs: Not on file  Physical Activity: Not on file  Stress: Not on file  Social Connections: Not on file  Intimate Partner Violence: Not on file    Family History  Problem Relation Age of Onset   Healthy Mother    Healthy Father    Healthy Sister    Healthy Brother    Diabetes Maternal Grandmother    Healthy Maternal Grandfather     Healthy Paternal Grandmother      Current Outpatient Medications:    estradiol cypionate (DEPO-ESTRADIOL) 5 MG/ML injection, Inject into the muscle every 3 (three) months., Disp: , Rfl:   No results found.  No images are attached to the encounter.   CMP Latest Ref Rng & Units 07/29/2019  Glucose 65 - 99 mg/dL 97(W)  BUN 6 - 20 mg/dL 11  Creatinine 2.63 - 7.85 mg/dL 8.85  Sodium 027 - 741 mmol/L 141  Potassium 3.5 - 5.2 mmol/L 3.7  Chloride 96 - 106 mmol/L 105  CO2 20 - 29 mmol/L 22  Calcium 8.7 - 10.2 mg/dL 9.4  Total Protein 6.0 - 8.5 g/dL 7.0  Total Bilirubin 0.0 - 1.2 mg/dL 0.4  Alkaline Phos 39 - 117 IU/L 87  AST 0 - 40 IU/L 22  ALT 0 - 32 IU/L 27   CBC Latest Ref Rng & Units 10/13/2020  WBC 4.0 - 10.5 K/uL 3.2(L)  Hemoglobin 12.0 - 15.0 g/dL 10.8(L)  Hematocrit 36.0 - 46.0 % 34.3(L)  Platelets 150 - 400 K/uL 174     Observation/objective:appears in no acute distress over video vsiit today. Bretahing is non labored  Assessment and plan: Patient is a 25 year old femaleAnd this is of routine follow-up visit for neutropenia   Patient has longstanding neutropenia at least for the last 1 year it has been stable with a white count that fluctuates between 2.5-3.5.  Platelet count is normal and hemoglobin fluctuates between 11-12.  She has not come for repeat CBC check at this time to see if there has been any change in her neutropenia.  I have asked her to come for a CBC with differential sometime in the next 1 to 2 weeks and if her counts remain stable overall she does not require follow-up with me  Follow-up instructions: As above  I discussed the assessment and treatment plan with the patient. The patient was provided an opportunity to ask questions and all were answered. The patient agreed with the plan and demonstrated an understanding of the instructions.   The patient was advised to call back or seek an in-person evaluation if the symptoms worsen or if the condition  fails to improve as anticipated.  Visit Diagnosis: 1. Neutropenia, unspecified type (HCC)     Dr. Owens Shark, MD, MPH [facility] at [facility] Tel- [phone] 02/17/2021 6:09 PM

## 2021-02-20 ENCOUNTER — Encounter: Payer: Self-pay | Admitting: General Surgery

## 2021-07-09 IMAGING — US US SOFT TISSUE HEAD/NECK
1 series · 14 of 25 positions shown · non-contrast
Comparison: None.

CLINICAL DATA: 24-year-old female with adenopathy

EXAM:
ULTRASOUND OF HEAD/NECK SOFT TISSUES
TECHNIQUE: Ultrasound examination of the head and neck soft tissues was
performed in the area of clinical concern.

[Series 1: us soft tissue neck · 29 acquisitions, 14 frames shown]
[im 1/29]
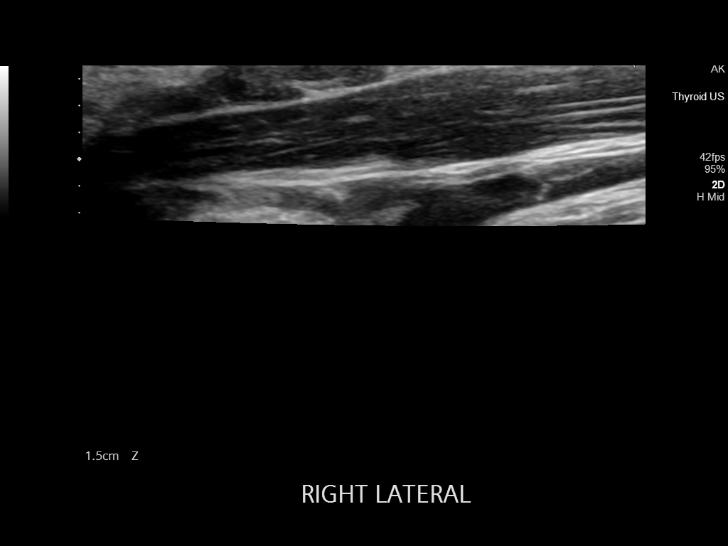
[im 3/29]
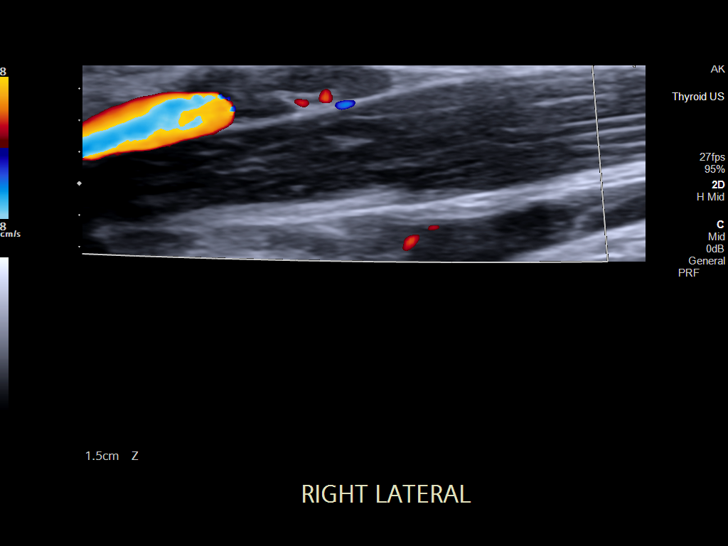
[im 5/29]
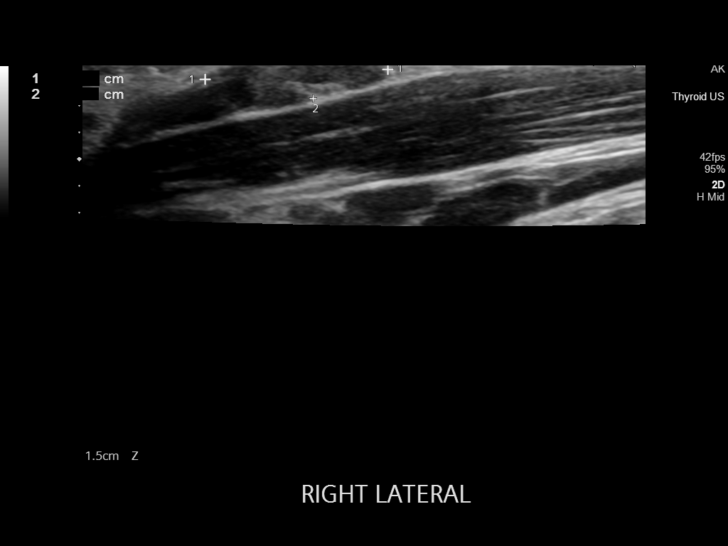
[im 8/29]
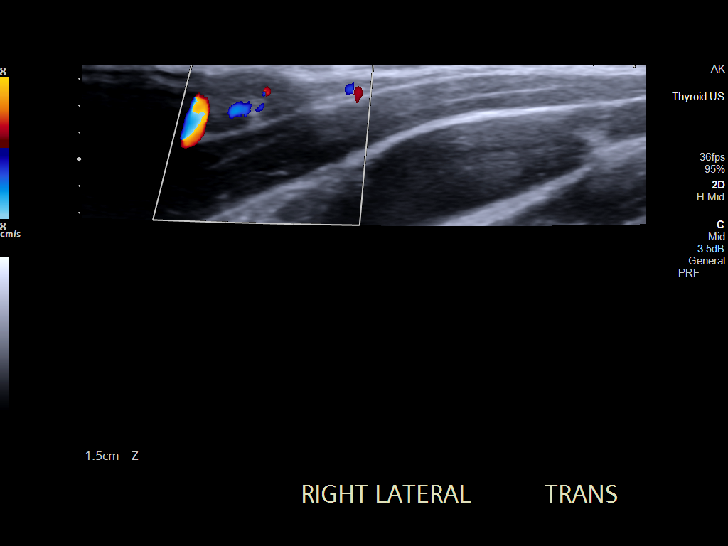
[im 10/29]
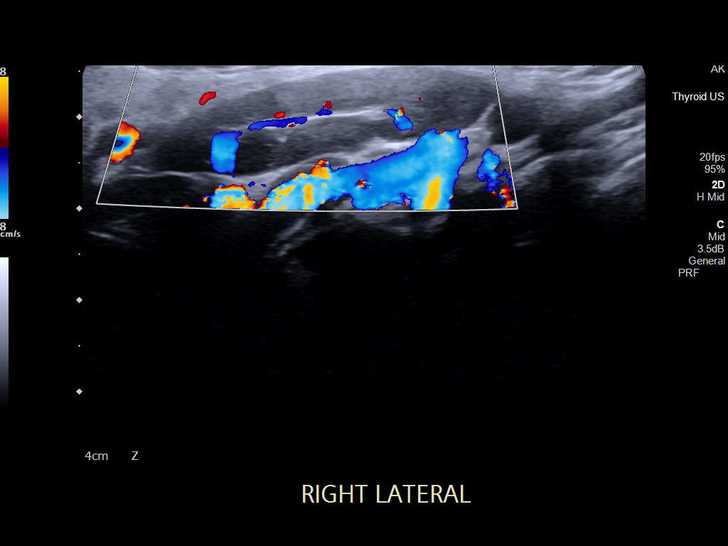
[im 11/29]
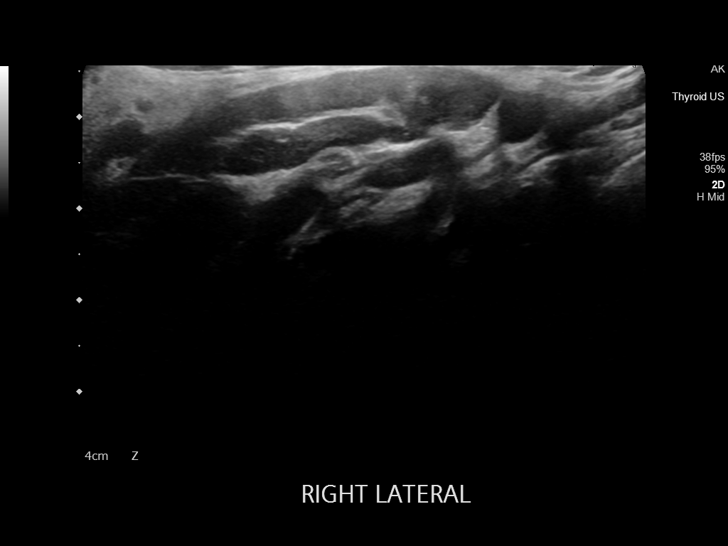
[im 13/29]
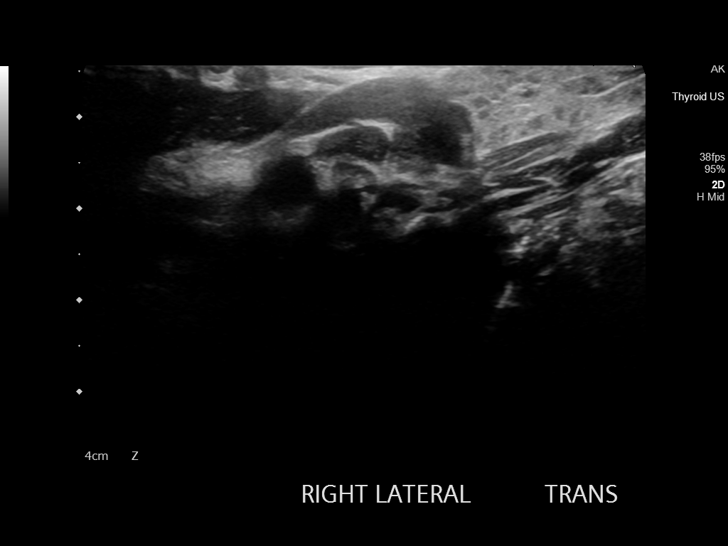
[im 16/29]
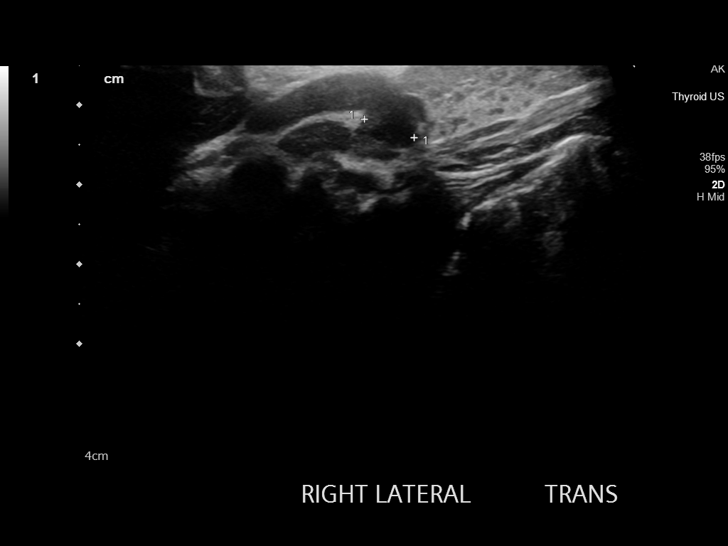
[im 18/29]
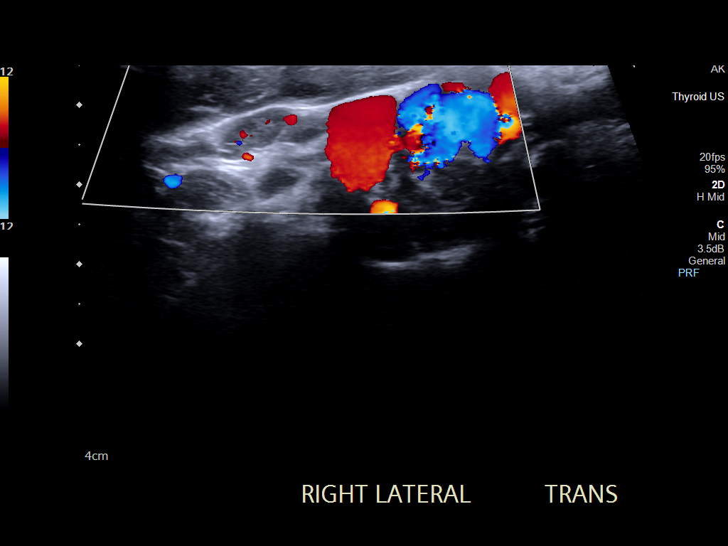
[im 19/29]
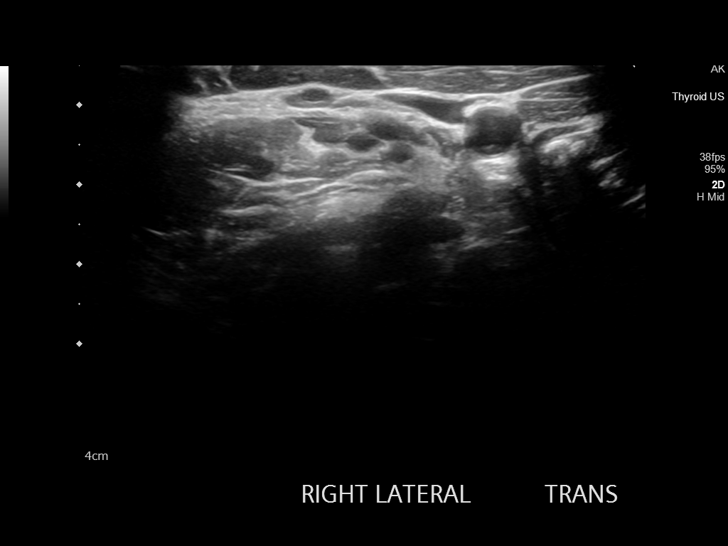
[im 22/29]
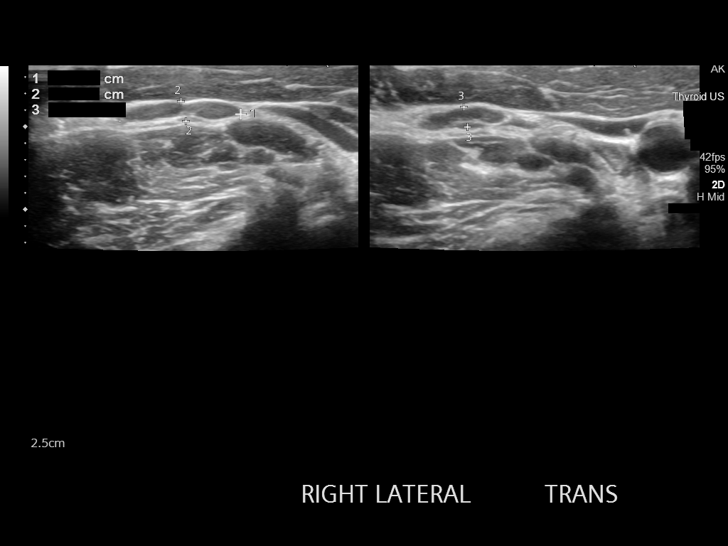
[im 24/29]
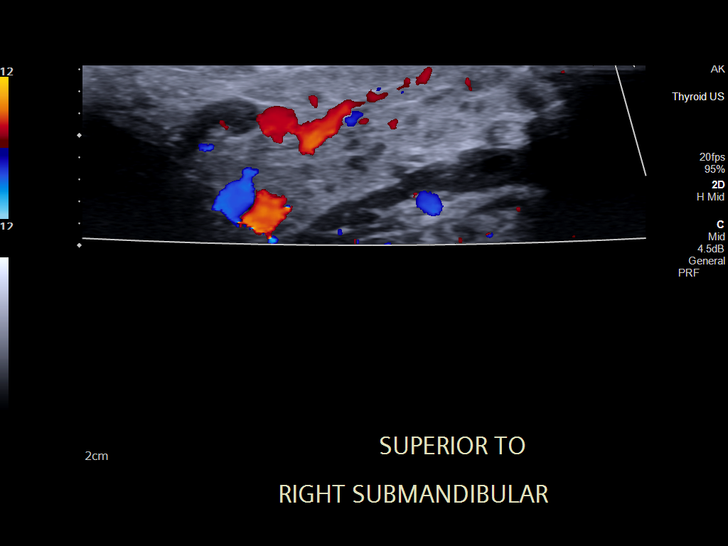
[im 26/29]
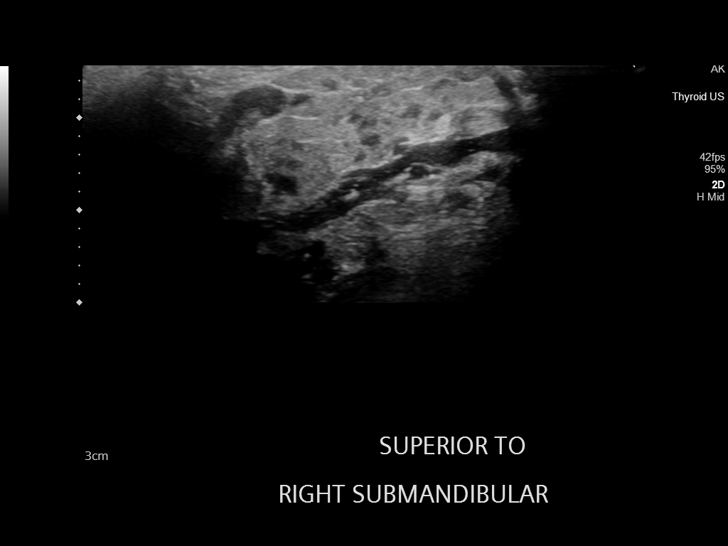
[im 29/29]
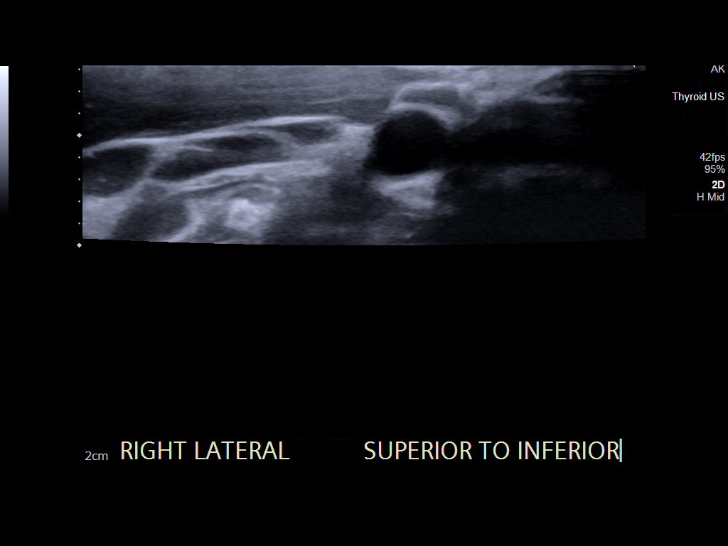

[14 of 25 positions shown; findings below may reference images not displayed]

FINDINGS: Grayscale and color duplex performed in the region clinical concern.

Multiple lymph nodes with typical architecture and the largest with
short axis dimension of 8 mm.

Heterogeneous appearance the right submandibular gland.
IMPRESSION: Multiple lymph nodes with typical architecture, nonspecific though
potentially reactive.

Heterogeneous right submandibular gland, could be seen with
sialadenitis.

## 2022-02-05 ENCOUNTER — Encounter: Payer: Medicaid Other | Admitting: Obstetrics & Gynecology

## 2022-02-05 ENCOUNTER — Telehealth: Payer: Self-pay | Admitting: *Deleted

## 2022-02-05 NOTE — Telephone Encounter (Signed)
Left message for pt to call and confirm if appt time change is ok or to call us back to reschedule

## 2022-03-07 ENCOUNTER — Ambulatory Visit
Admission: EM | Admit: 2022-03-07 | Discharge: 2022-03-07 | Disposition: A | Discharge status: Home / Self Care | Payer: BLUE CROSS/BLUE SHIELD | Attending: Urgent Care | Admitting: Urgent Care

## 2022-03-07 DIAGNOSIS — J029 Acute pharyngitis, unspecified: Secondary | ICD-10-CM | POA: Diagnosis not present

## 2022-03-07 DIAGNOSIS — J069 Acute upper respiratory infection, unspecified: Secondary | ICD-10-CM | POA: Diagnosis not present

## 2022-03-07 LAB — POCT RAPID STREP A (OFFICE): Rapid Strep A Screen: NEGATIVE

## 2022-03-07 NOTE — ED Triage Notes (Signed)
Pt reports sore throat, swollen tonsils x 3 days.  Denies nasal congestion, cough, fevers.  No meds today.

## 2022-03-07 NOTE — ED Provider Notes (Signed)
Renaldo Fiddler    CSN: [id number] Arrival date & time: 03/07/22  0815      History   Chief Complaint Chief Complaint  Patient presents with   Sore Throat    HPI Erica Johns is a 26 y.o. female.    Sore Throat    Presents to urgent care with complaint of sore throat, swollen tonsils x3 days.  Endorses some nasal congestion at nighttime.  As well as cough both day and night.  Denies fever, GI pain, nausea/vomiting/diarrhea.  Using Allegra as well as DayQuil for some symptom control.  Past Medical History:  Diagnosis Date   Neutropenia (HCC)     There are no problems to display for this patient.   Past Surgical History:  Procedure Laterality Date   NO PAST SURGERIES      OB History   No obstetric history on file.      Home Medications    Prior to Admission medications   Medication Sig Start Date End Date Taking? Authorizing Provider  estradiol cypionate (DEPO-ESTRADIOL) 5 MG/ML injection Inject into the muscle every 3 (three) months.    [provider]    Family History Family History  Problem Relation Age of Onset   Healthy Mother    Healthy Father    Healthy Sister    Healthy Brother    Diabetes Maternal Grandmother    Healthy Maternal Grandfather    Healthy Paternal Grandmother     Social History Social History   Tobacco Use   Smoking status: Never   Smokeless tobacco: Never  Vaping Use   Vaping Use: Never used  Substance Use Topics   Alcohol use: No    Alcohol/week: 0.0 standard drinks of alcohol   Drug use: No     Allergies   Patient has no known allergies.   Review of Systems Review of Systems   Physical Exam Triage Vital Signs ED Triage Vitals  Enc Vitals Group     BP 03/07/22 0827 113/79     Pulse Rate 03/07/22 0827 91     Resp 03/07/22 0827 16     Temp 03/07/22 0827 99.3 F (37.4 C)     Temp Source 03/07/22 0827 Oral     SpO2 03/07/22 0827 98 %     Weight --      Height --      Head  Circumference --      Peak Flow --      Pain Score 03/07/22 0837 4     Pain Loc --      Pain Edu? --      Excl. in GC? --    No data found.  Updated Vital Signs BP 113/79 (BP Location: Left Arm)   Pulse 91   Temp 99.3 F (37.4 C) (Oral)   Resp 16   LMP 02/21/2022 Comment: has been on period since 09/21.  has gyn appt.  SpO2 98%   Visual Acuity Right Eye Distance:   Left Eye Distance:   Bilateral Distance:    Right Eye Near:   Left Eye Near:    Bilateral Near:     Physical Exam Vitals reviewed.  Constitutional:      Appearance: She is well-developed.  HENT:     Head: Normocephalic.     Mouth/Throat:     Mouth: Mucous membranes are moist.     Pharynx: Posterior oropharyngeal erythema present. No oropharyngeal exudate.     Tonsils: 1+ on the right. 1+  on the left.  Eyes:     Conjunctiva/sclera: Conjunctivae normal.     Pupils: Pupils are equal, round, and reactive to light.  Cardiovascular:     Rate and Rhythm: Normal rate and regular rhythm.     Heart sounds: Normal heart sounds.  Pulmonary:     Effort: Pulmonary effort is normal.     Breath sounds: Normal breath sounds.  Abdominal:     General: Bowel sounds are normal.     Palpations: Abdomen is soft.  Skin:    General: Skin is warm and dry.  Neurological:     General: No focal deficit present.     Mental Status: She is alert and oriented to person, place, and time.  Psychiatric:        Mood and Affect: Mood normal.        Behavior: Behavior normal.      UC Treatments / Results  Labs (all labs ordered are listed, but only abnormal results are displayed) Labs Reviewed  POCT RAPID STREP A (OFFICE)    EKG   Radiology No results found.  Procedures Procedures (including critical care time)  Medications Ordered in UC Medications - No data to display  Initial Impression / Assessment and Plan / UC Course  I have reviewed the triage vital signs and the nursing notes.  Pertinent labs & imaging  results that were available during my care of the patient were reviewed by me and considered in my medical decision making (see chart for details).   Rapid strep is negative.  Suspect viral etiology for her symptoms which appear mild.  Physical exam is remarkable only for mildly erythematous pharynx and 1+ tonsils bilaterally.  Continue to recommend OTC medication for symptom control.  Final Clinical Impressions(s) / UC Diagnoses   Final diagnoses:  None   Discharge Instructions   None    ED Prescriptions   None    PDMP not reviewed this encounter.   Rose Phi,  03/07/22 [phone]

## 2022-03-07 NOTE — Discharge Instructions (Addendum)
Follow up here or with your primary care provider if your symptoms are worsening or not improving.     

## 2022-03-14 ENCOUNTER — Telehealth: Payer: Self-pay

## 2022-03-14 NOTE — Telephone Encounter (Signed)
Called pt to confirm appointment for tomorrow.  Left message for pt to call and confirm appointment by 1500 10/16  

## 2022-03-19 ENCOUNTER — Encounter: Payer: BLUE CROSS/BLUE SHIELD | Admitting: Obstetrics & Gynecology

## 2022-03-21 ENCOUNTER — Ambulatory Visit (INDEPENDENT_AMBULATORY_CARE_PROVIDER_SITE_OTHER): Payer: BLUE CROSS/BLUE SHIELD | Admitting: Radiology

## 2022-03-21 ENCOUNTER — Encounter: Payer: Self-pay | Admitting: Radiology

## 2022-03-21 VITALS — BP 102/68 | Ht 64.0 in | Wt 128.0 lb

## 2022-03-21 DIAGNOSIS — N926 Irregular menstruation, unspecified: Secondary | ICD-10-CM

## 2022-03-21 LAB — PREGNANCY, URINE: Preg Test, Ur: NEGATIVE

## 2022-03-21 MED ORDER — JUNEL FE 24 1-20 MG-MCG(24) PO TABS: 1.0000 | ORAL_TABLET | Freq: Every day | ORAL | 0 refills | Status: DC

## 2022-03-21 NOTE — Progress Notes (Signed)
   Erica Johns 27-Nov-1995 [id number]   History:  26 y.o. G0 here as a new patient. She c/o irregular, heavy periods, every 2 weeks x's 1 year after she stopped depo provera 1 year ago. Interested in becoming pregnant in the next year or two. Does having cramping and headaches with cycles.   Gynecologic History Patient's last menstrual period was 02/21/2022 (exact date). Period Pattern: (!) Irregular Menstrual Flow: Heavy Menstrual Control: Tampon, Maxi pad Dysmenorrhea: (!) Severe Dysmenorrhea Symptoms: Cramping Contraception/Family planning: condoms Sexually active: yes Last Pap: 2021. Results were: normal   Obstetric History OB History  Gravida Para Term Preterm AB Living  0 0 0 0 0 0  SAB IAB Ectopic Multiple Live Births  0 0 0 0 0     The following portions of the patient's history were reviewed and updated as appropriate: allergies, current medications, past family history, past medical history, past social history, past surgical history, and problem list.  Review of Systems Pertinent items noted in HPI and remainder of comprehensive ROS otherwise negative.   Past medical history, past surgical history, family history and social history were all reviewed and documented in the EPIC chart.   Exam:  Vitals:   03/21/22 0946  BP: 102/68  Weight: 128 lb (58.1 kg)  Height: 5\' 4"  (1.626 m)   Body mass index is 21.97 kg/m.  General appearance:  Normal Abdominal  Soft,nontender, without masses, guarding or rebound.  Liver/spleen:  No organomegaly noted  Hernia:  None appreciated Genitourinary   Inguinal/mons:  Normal without inguinal adenopathy  External genitalia:  Normal appearing vulva with no masses, tenderness, or lesions  BUS/Urethra/Skene's glands:  Normal without masses or exudate  Vagina:  Normal appearing with normal color and discharge, no lesions  Cervix:  Normal appearing without discharge or lesions  Uterus:  Normal in size, shape and contour.   Mobile, nontender  Adnexa/parametria:     Rt: Normal in size, without masses or tenderness.   Lt: Normal in size, without masses or tenderness.  Anus and perineum: Normal   Patient informed chaperone available to be present for breast and pelvic exam. Patient has requested no chaperone to be present. Patient has been advised what will be completed during breast and pelvic exam.   Assessment/Plan:   1. Irregular periods Will trial OCPs x 3 months to reset cycles.  - Pregnancy, urine;neg - Norethindrone Acetate-Ethinyl Estrad-FE (JUNEL FE 24) 1-20 MG-MCG(24) tablet; Take 1 tablet by mouth daily. Take continuously, skip placebos  Dispense: 84 tablet; Refill: 0   Follow up 3 months  [name] B WHNP-BC 10:02 AM 03/21/2022

## 2022-04-02 ENCOUNTER — Ambulatory Visit: Payer: Self-pay | Admitting: Family Medicine

## 2022-05-21 ENCOUNTER — Telehealth: Payer: Self-pay

## 2022-05-21 NOTE — Telephone Encounter (Signed)
Called pt to confirm appointment for tomorrow.  Left message for pt to call and confirm appointment by 1500 tomorrow

## 2022-05-23 ENCOUNTER — Encounter: Payer: BLUE CROSS/BLUE SHIELD | Admitting: Obstetrics and Gynecology

## 2022-06-06 ENCOUNTER — Other Ambulatory Visit: Payer: Self-pay | Admitting: Radiology

## 2022-06-06 DIAGNOSIS — N926 Irregular menstruation, unspecified: Secondary | ICD-10-CM

## 2022-06-19 ENCOUNTER — Ambulatory Visit (INDEPENDENT_AMBULATORY_CARE_PROVIDER_SITE_OTHER): Payer: BLUE CROSS/BLUE SHIELD | Admitting: Radiology

## 2022-06-19 VITALS — BP 102/60

## 2022-06-19 DIAGNOSIS — Z309 Encounter for contraceptive management, unspecified: Secondary | ICD-10-CM

## 2022-06-19 DIAGNOSIS — N926 Irregular menstruation, unspecified: Secondary | ICD-10-CM

## 2022-06-19 NOTE — Progress Notes (Signed)
   Erica Johns Nov 30, 1995 [id number]   History:  27 y.o. G0 here for follow up after starting OCPs to regulate cycles. Has been on for 3 months now, no period last month, just spotting this month, Would like to go off to see if they re regulate themselves.  Gynecologic History Patient's last menstrual period was 04/09/2022 (exact date).   Contraception/Family planning: condoms Sexually active: yes Last Pap: 2021. Results were: normal   Obstetric History OB History  Gravida Para Term Preterm AB Living  0 0 0 0 0 0  SAB IAB Ectopic Multiple Live Births  0 0 0 0 0     The following portions of the patient's history were reviewed and updated as appropriate: allergies, current medications, past family history, past medical history, past social history, past surgical history, and problem list.  Review of Systems Pertinent items noted in HPI and remainder of comprehensive ROS otherwise negative.   Past medical history, past surgical history, family history and social history were all reviewed and documented in the EPIC chart.   Exam:  Vitals:   06/19/22 0946  BP: 102/60   There is no height or weight on file to calculate BMI.  Physical Exam Vitals and nursing note reviewed.  Constitutional:      Appearance: She is normal weight.  Neurological:     Mental Status: She is alert.  Psychiatric:        Mood and Affect: Mood normal.        Thought Content: Thought content normal.        Judgment: Judgment normal.      Assessment/Plan:   1. Irregular periods Resolved Would like to come off BC short term to see if periods will regular Will call should she desire to restart  Rubbie Battiest B WHNP-BC 10:26 AM 06/19/2022

## 2022-06-26 ENCOUNTER — Ambulatory Visit: Payer: BLUE CROSS/BLUE SHIELD | Admitting: Radiology

## 2023-03-25 ENCOUNTER — Ambulatory Visit (INDEPENDENT_AMBULATORY_CARE_PROVIDER_SITE_OTHER): Payer: Self-pay | Admitting: Family Medicine

## 2023-03-25 ENCOUNTER — Encounter: Payer: Self-pay | Admitting: Family Medicine

## 2023-03-25 VITALS — BP 109/74 | HR 76 | Ht 64.0 in | Wt 127.0 lb

## 2023-03-25 DIAGNOSIS — D649 Anemia, unspecified: Secondary | ICD-10-CM

## 2023-03-25 DIAGNOSIS — D72819 Decreased white blood cell count, unspecified: Secondary | ICD-10-CM

## 2023-03-25 DIAGNOSIS — Z0001 Encounter for general adult medical examination with abnormal findings: Secondary | ICD-10-CM

## 2023-03-25 DIAGNOSIS — I7300 Raynaud's syndrome without gangrene: Secondary | ICD-10-CM

## 2023-03-25 DIAGNOSIS — Z0000 Encounter for general adult medical examination without abnormal findings: Secondary | ICD-10-CM

## 2023-03-25 NOTE — Progress Notes (Addendum)
Patient: Erica Johns   DOB: [birth year]   27 y.o. Female  MRN: [id number]  Subjective:    Chief Complaint  Patient presents with   Annual Exam    Erica Johns is a 27 y.o. female who presents today for a complete physical exam, but not covered by insurance. She reports consuming a general diet.  She generally feels well. She reports sleeping well. She does have additional problems to discuss today.   Discussed the use of AI scribe software for clinical note transcription with the patient, who gave verbal consent to proceed.  History of Present Illness   The patient, a 27 year old with a history of neutropenia and anemia, presents for a physical examination. She reports a persistent feeling of coldness, particularly noticeable in cold environments such as the frozen aisle of a grocery store. In these situations, her fingers turn purple. She also notes the presence of red spots on her toes and fingertips. The patient's neutropenia has been monitored over the years, with lab work done every month initially, then every six months, and then annually. The patient has not had lab work done for over a year. The patient also has a history of anemia, which may contribute to her feeling of coldness.        Most recent fall risk assessment:    03/25/2023    9:50 AM  Fall Risk   Falls in the past year? 0  Number falls in past yr: 0  Injury with Fall? 0     Most recent depression screenings:    03/25/2023    9:50 AM 07/29/2019    9:16 AM  PHQ 2/9 Scores  PHQ - 2 Score 0 0  PHQ- 9 Score 0 0        Patient Care Team: Erasmo Downer, MD as PCP - General (Family Medicine) Tanda Rockers, NP as Nurse Practitioner (Radiology)   Outpatient Medications Prior to Visit  Medication Sig   [DISCONTINUED] HAILEY 24 FE 1-20 MG-MCG(24) tablet TAKE 1 TABLET BY MOUTH DAILY. TAKE CONTINUOUSLY, SKIP PLACEBOS   No facility-administered medications prior to visit.     ROS      Objective:     BP 109/74   Pulse 76   Ht 5\' 4"  (1.626 m)   Wt 127 lb (57.6 kg)   LMP 03/06/2023   SpO2 98%   BMI 21.80 kg/m    Physical Exam Vitals reviewed.  Constitutional:      General: She is not in acute distress.    Appearance: Normal appearance. She is well-developed. She is not diaphoretic.  HENT:     Head: Normocephalic and atraumatic.     Right Ear: Tympanic membrane, ear canal and external ear normal.     Left Ear: Tympanic membrane, ear canal and external ear normal.     Nose: Nose normal.     Mouth/Throat:     Mouth: Mucous membranes are moist.     Pharynx: Oropharynx is clear. No oropharyngeal exudate.  Eyes:     General: No scleral icterus.    Conjunctiva/sclera: Conjunctivae normal.     Pupils: Pupils are equal, round, and reactive to light.  Neck:     Thyroid: No thyromegaly.  Cardiovascular:     Rate and Rhythm: Normal rate and regular rhythm.     Heart sounds: Normal heart sounds. No murmur heard. Pulmonary:     Effort: Pulmonary effort is normal. No respiratory distress.  Breath sounds: Normal breath sounds. No wheezing or rales.  Chest:     Comments: Breasts: breasts appear normal, no suspicious masses, no skin or nipple changes or axillary nodes  Abdominal:     General: There is no distension.     Palpations: Abdomen is soft.     Tenderness: There is no abdominal tenderness.  Musculoskeletal:        General: No deformity.     Cervical back: Neck supple.     Right lower leg: No edema.     Left lower leg: No edema.  Lymphadenopathy:     Cervical: No cervical adenopathy.  Skin:    General: Skin is warm and dry.     Findings: No rash.  Neurological:     Mental Status: She is alert and oriented to person, place, and time. Mental status is at baseline.     Gait: Gait normal.  Psychiatric:        Mood and Affect: Mood normal.        Behavior: Behavior normal.        Thought Content: Thought content normal.       Results for orders placed or performed in visit on 03/25/23  TSH  Result Value Ref Range   TSH 2.490 0.450 - 4.500 uIU/mL  CBC w/Diff/Platelet  Result Value Ref Range   WBC 2.3 (LL) 3.4 - 10.8 x10E3/uL   RBC 4.26 3.77 - 5.28 x10E6/uL   Hemoglobin 10.9 (L) 11.1 - 15.9 g/dL   Hematocrit 16.1 09.6 - 46.6 %   MCV 85 79 - 97 fL   MCH 25.6 (L) 26.6 - 33.0 pg   MCHC 30.2 (L) 31.5 - 35.7 g/dL   RDW 04.5 40.9 - 81.1 %   Platelets 231 150 - 450 x10E3/uL   Neutrophils 32 Not Estab. %   Lymphs 54 Not Estab. %   Monocytes 11 Not Estab. %   Eos 2 Not Estab. %   Basos 1 Not Estab. %   Neutrophils Absolute 0.7 (L) 1.4 - 7.0 x10E3/uL   Lymphocytes Absolute 1.2 0.7 - 3.1 x10E3/uL   Monocytes Absolute 0.3 0.1 - 0.9 x10E3/uL   EOS (ABSOLUTE) 0.1 0.0 - 0.4 x10E3/uL   Basophils Absolute 0.0 0.0 - 0.2 x10E3/uL   Immature Granulocytes 0 Not Estab. %   Immature Grans (Abs) 0.0 0.0 - 0.1 x10E3/uL   Hematology Comments: Note:   Comprehensive metabolic panel  Result Value Ref Range   Glucose 85 70 - 99 mg/dL   BUN 7 6 - 20 mg/dL   Creatinine, Ser 9.14 0.57 - 1.00 mg/dL   eGFR 782 >95 AO/ZHY/8.65   BUN/Creatinine Ratio 11 9 - 23   Sodium 139 134 - 144 mmol/L   Potassium 3.7 3.5 - 5.2 mmol/L   Chloride 104 96 - 106 mmol/L   CO2 22 20 - 29 mmol/L   Calcium 9.5 8.7 - 10.2 mg/dL   Total Protein 7.7 6.0 - 8.5 g/dL   Albumin 4.2 4.0 - 5.0 g/dL   Globulin, Total 3.5 1.5 - 4.5 g/dL   Bilirubin Total 0.3 0.0 - 1.2 mg/dL   Alkaline Phosphatase 70 44 - 121 IU/L   AST 19 0 - 40 IU/L   ALT 22 0 - 32 IU/L       Assessment & Plan:    Routine Health Maintenance  Immunization History  Administered Date(s) Administered   DTaP 12/27/1997, 03/08/1998, 05/24/1998, 06/14/1999, 08/13/2002   HIB (PRP-OMP) 12/27/1997, 03/08/1998, 05/24/1998, 01/22/1999  HPV Quadrivalent 09/02/2014   Hepatitis A 09/02/2014   Hepatitis B 1997[birth year]08[birth year], 11/25/1997, 05/24/1998   IPV 12/27/1997, 03/08/1998, 05/24/1998,  08/13/2002   MMR 06/14/1999, 08/13/2002   Meningococcal Conjugate 09/02/2014   Td 11/28/2008   Tdap 11/28/2008, 10/20/2019   Varicella 01/22/1999, 12/27/2008    Health Maintenance  Topic Date Due   HPV VACCINES (2 - 3-dose series) 09/30/2014   Cervical Cancer Screening (Pap smear)  07/28/2022   COVID-19 Vaccine (1 - 2023-24 season) Never done   INFLUENZA VACCINE  09/01/2023 (Originally 01/02/2023)   DTaP/Tdap/Td (9 - Td or Tdap) 10/19/2029   Hepatitis C Screening  Completed   HIV Screening  Completed    Discussed health benefits of physical activity, and encouraged her to engage in regular exercise appropriate for her age and condition.  Problem List Items Addressed This Visit   None Visit Diagnoses     Leukopenia, unspecified type    -  Primary   Relevant Orders   TSH (Completed)   CBC w/Diff/Platelet (Completed)   Comprehensive metabolic panel (Completed)   Raynaud's phenomenon without gangrene       Anemia, unspecified type       Relevant Orders   TSH (Completed)   CBC w/Diff/Platelet (Completed)   Comprehensive metabolic panel (Completed)          Neutropenia Long-standing low white blood cell count, previously evaluated by Hematology. Stable for a long time at mid to high twos. Likely non-pathologic and may be patient's normal. -Check complete blood count today to monitor current levels.  Anemia Previous history of mild anemia, which may contribute to feeling cold. -Check complete blood count today to monitor current levels.  Raynaud's Phenomenon Reports of fingers turning purple in cold environments, suggestive of Raynaud's. -Advised to keep hands warm, especially in cold environments.  Cervical Cancer Screening Last Pap smear was in 2021, due for another. -Advised to schedule Pap smear with Gynecologist.  HPV Vaccination Incomplete HPV vaccination series. -Advised to schedule next HPV vaccine.  General Health Maintenance -Check kidney and liver  function, blood sugar, and thyroid function today. -Schedule next year's physical exam.       Return in about 1 year (around 03/24/2024) for CPE.     Shirlee Latch, MD

## 2023-03-26 LAB — COMPREHENSIVE METABOLIC PANEL
ALT: 22 [IU]/L (ref 0–32)
AST: 19 [IU]/L (ref 0–40)
Albumin: 4.2 g/dL (ref 4.0–5.0)
Alkaline Phosphatase: 70 [IU]/L (ref 44–121)
BUN/Creatinine Ratio: 11 (ref 9–23)
BUN: 7 mg/dL (ref 6–20)
Bilirubin Total: 0.3 mg/dL (ref 0.0–1.2)
CO2: 22 mmol/L (ref 20–29)
Calcium: 9.5 mg/dL (ref 8.7–10.2)
Chloride: 104 mmol/L (ref 96–106)
Creatinine, Ser: 0.61 mg/dL (ref 0.57–1.00)
Globulin, Total: 3.5 g/dL (ref 1.5–4.5)
Glucose: 85 mg/dL (ref 70–99)
Potassium: 3.7 mmol/L (ref 3.5–5.2)
Sodium: 139 mmol/L (ref 134–144)
Total Protein: 7.7 g/dL (ref 6.0–8.5)
eGFR: 127 mL/min/{1.73_m2} (ref >59)

## 2023-03-26 LAB — CBC WITH DIFFERENTIAL/PLATELET
Basophils Absolute: 0 10*3/uL (ref 0.0–0.2)
Basos: 1 %
EOS (ABSOLUTE): 0.1 10*3/uL (ref 0.0–0.4)
Eos: 2 %
Hematocrit: 36.1 % (ref 34.0–46.6)
Hemoglobin: 10.9 g/dL — ABNORMAL LOW (ref 11.1–15.9)
Immature Grans (Abs): 0 10*3/uL (ref 0.0–0.1)
Immature Granulocytes: 0 %
Lymphocytes Absolute: 1.2 10*3/uL (ref 0.7–3.1)
Lymphs: 54 %
MCH: 25.6 pg — ABNORMAL LOW (ref 26.6–33.0)
MCHC: 30.2 g/dL — ABNORMAL LOW (ref 31.5–35.7)
MCV: 85 fL (ref 79–97)
Monocytes Absolute: 0.3 10*3/uL (ref 0.1–0.9)
Monocytes: 11 %
Neutrophils Absolute: 0.7 10*3/uL — ABNORMAL LOW (ref 1.4–7.0)
Neutrophils: 32 %
Platelets: 231 10*3/uL (ref 150–450)
RBC: 4.26 x10E6/uL (ref 3.77–5.28)
RDW: 11.9 % (ref 11.7–15.4)
WBC: 2.3 10*3/uL — CL (ref 3.4–10.8)

## 2023-03-26 LAB — TSH: TSH: 2.49 u[IU]/mL (ref 0.450–4.500)

## 2023-04-01 NOTE — Addendum Note (Signed)
Addended by: Erasmo Downer on: 04/01/2023 11:48 AM   Modules accepted: Level of Service

## 2023-08-15 ENCOUNTER — Ambulatory Visit: Payer: Self-pay | Admitting: Family Medicine

## 2023-08-15 NOTE — Telephone Encounter (Signed)
 Copied from CRM [id number]. Topic: Clinical - Medical Advice >> Aug 15, 2023  8:51 AM Tiffany B wrote: Reason for CRM: Patient states she's experincing left ear pain on and off. Patient states she was seen by provider for this concern and stated if he continues she would prescribe an antibiotic. Patient would like to see PCP only. PCP does not have any available appointments until 4/3

## 2023-08-15 NOTE — Telephone Encounter (Signed)
 Called patient back. NA not able to leave message. Ok for [employer] nurse to advise patient and schedule.  Patient last office visit was 03/25/2023. Dr. Beryle Flock is not in the office today or next week. Patient can either get an appointment with another provider in the office or do a telehealth.

## 2023-08-15 NOTE — Telephone Encounter (Signed)
 This RN made 3rd attempt to reach pt, "call cannot be completed as dialed"  Copied from CRM [id number]. Topic: Clinical - Medical Advice >> Aug 15, 2023  8:51 AM Tiffany B wrote: Reason for CRM: Patient states she's experincing left ear pain on and off. Patient states she was seen by provider for this concern and stated if he continues she would prescribe an antibiotic. Patient would like to see PCP only. PCP does not have any available appointments until 4/3

## 2023-12-17 ENCOUNTER — Other Ambulatory Visit: Payer: Self-pay

## 2023-12-17 ENCOUNTER — Emergency Department
Admission: EM | Admit: 2023-12-17 | Discharge: 2023-12-17 | Disposition: A | Discharge status: Home / Self Care | Payer: PRIVATE HEALTH INSURANCE | Attending: Emergency Medicine | Admitting: Emergency Medicine

## 2023-12-17 ENCOUNTER — Emergency Department: Payer: PRIVATE HEALTH INSURANCE

## 2023-12-17 DIAGNOSIS — R000 Tachycardia, unspecified: Secondary | ICD-10-CM | POA: Diagnosis not present

## 2023-12-17 DIAGNOSIS — R509 Fever, unspecified: Secondary | ICD-10-CM | POA: Insufficient documentation

## 2023-12-17 DIAGNOSIS — R0789 Other chest pain: Secondary | ICD-10-CM | POA: Diagnosis present

## 2023-12-17 DIAGNOSIS — R0609 Other forms of dyspnea: Secondary | ICD-10-CM | POA: Diagnosis not present

## 2023-12-17 DIAGNOSIS — R079 Chest pain, unspecified: Secondary | ICD-10-CM

## 2023-12-17 LAB — TROPONIN I (HIGH SENSITIVITY)
Troponin I (High Sensitivity): 5 ng/L (ref <18)
Troponin I (High Sensitivity): 5 ng/L (ref <18)

## 2023-12-17 LAB — CBC
HCT: 34.4 % — ABNORMAL LOW (ref 36.0–46.0)
Hemoglobin: 10.9 g/dL — ABNORMAL LOW (ref 12.0–15.0)
MCH: 26.2 pg (ref 26.0–34.0)
MCHC: 31.7 g/dL (ref 30.0–36.0)
MCV: 82.7 fL (ref 80.0–100.0)
Platelets: 181 K/uL (ref 150–400)
RBC: 4.16 MIL/uL (ref 3.87–5.11)
RDW: 12.9 % (ref 11.5–15.5)
WBC: 4.6 K/uL (ref 4.0–10.5)
nRBC: 0 % (ref 0.0–0.2)

## 2023-12-17 LAB — HEPATIC FUNCTION PANEL
ALT: 23 U/L (ref 0–44)
AST: 33 U/L (ref 15–41)
Albumin: 3.5 g/dL (ref 3.5–5.0)
Alkaline Phosphatase: 50 U/L (ref 38–126)
Bilirubin, Direct: 0.1 mg/dL (ref 0.0–0.2)
Indirect Bilirubin: 0.5 mg/dL (ref 0.3–0.9)
Total Bilirubin: 0.6 mg/dL (ref 0.0–1.2)
Total Protein: 8.5 g/dL — ABNORMAL HIGH (ref 6.5–8.1)

## 2023-12-17 LAB — BRAIN NATRIURETIC PEPTIDE: B Natriuretic Peptide: 58.7 pg/mL (ref 0.0–100.0)

## 2023-12-17 LAB — BASIC METABOLIC PANEL WITH GFR
Anion gap: 8 (ref 5–15)
BUN: 9 mg/dL (ref 6–20)
CO2: 22 mmol/L (ref 22–32)
Calcium: 8.7 mg/dL — ABNORMAL LOW (ref 8.9–10.3)
Chloride: 105 mmol/L (ref 98–111)
Creatinine, Ser: 0.41 mg/dL — ABNORMAL LOW (ref 0.44–1.00)
GFR, Estimated: >60 mL/min (ref >60)
Glucose, Bld: 108 mg/dL — ABNORMAL HIGH (ref 70–99)
Potassium: 3.7 mmol/L (ref 3.5–5.1)
Sodium: 135 mmol/L (ref 135–145)

## 2023-12-17 LAB — D-DIMER, QUANTITATIVE: D-Dimer, Quant: 0.51 ug{FEU}/mL — ABNORMAL HIGH (ref 0.00–0.50)

## 2023-12-17 LAB — RESP PANEL BY RT-PCR (RSV, FLU A&B, COVID)  RVPGX2
Influenza A by PCR: NEGATIVE
Influenza B by PCR: NEGATIVE
Resp Syncytial Virus by PCR: NEGATIVE
SARS Coronavirus 2 by RT PCR: NEGATIVE

## 2023-12-17 LAB — SEDIMENTATION RATE: Sed Rate: 49 mm/h — ABNORMAL HIGH (ref 0–20)

## 2023-12-17 LAB — POC URINE PREG, ED: Preg Test, Ur: NEGATIVE

## 2023-12-17 LAB — LIPASE, BLOOD: Lipase: 28 U/L (ref 11–51)

## 2023-12-17 MED ORDER — SODIUM CHLORIDE 0.9 % IV BOLUS
1000.0000 mL | Freq: Once | INTRAVENOUS | Status: AC
  Administered 2023-12-17: 1000 mL via INTRAVENOUS

## 2023-12-17 MED ORDER — KETOROLAC TROMETHAMINE 15 MG/ML IJ SOLN
15.0000 mg | Freq: Once | INTRAMUSCULAR | Status: AC
  Administered 2023-12-17: 15 mg via INTRAVENOUS
  Filled 2023-12-17: qty 1

## 2023-12-17 MED ORDER — ACETAMINOPHEN 325 MG PO TABS
650.0000 mg | ORAL_TABLET | Freq: Once | ORAL | Status: AC | PRN
  Administered 2023-12-17: 650 mg via ORAL
  Filled 2023-12-17: qty 2

## 2023-12-17 MED ORDER — IOHEXOL 350 MG/ML SOLN
75.0000 mL | Freq: Once | INTRAVENOUS | Status: AC | PRN
  Administered 2023-12-17: 75 mL via INTRAVENOUS

## 2023-12-17 NOTE — Discharge Instructions (Signed)
 Fortunately evaluation in the Emergency Department did not show any emergent conditions that account for your symptoms  I made a referral to a primary doctor who should call you to establish care as a new patient.    I also made referral to cardiology - should your symptoms linger for the next few days and continue to bother you then you can give them a call to schedule an appointment to check for heart conditions that may account for your symptoms.  Thank you for choosing us  for your health care today!  Please see your primary doctor this week for a follow up appointment.   If you have any new, worsening, or unexpected symptoms call your doctor right away or come back to the emergency department for reevaluation.  It was my pleasure to care for you today.   Ginnie EDISON Cyrena, MD

## 2023-12-17 NOTE — ED Provider Notes (Signed)
 [facility] Provider Note    Event Date/Time   First MD Initiated Contact with Patient 12/17/23 [phone]     (approximate)   History   Chest Pain   HPI  Erica Johns is a 28 y.o. female   Past medical history of healthy young woman who presents to the Emergency Department 2 days of chest tightness, chest pain, shortness of breath, worse with exertion, worse with laying down, but no other respiratory infectious symptoms like cough or congestion or known sick contacts.  She returned from a trip to [place] with her fianc 1 month ago and has been feeling well since then.  No leg pain or swelling.  Up until yesterday was feeling well when she had acute onset of the above symptoms.  She denies any GI symptoms like nausea vomiting diarrhea or abdominal pain.  No GU symptoms.  No known sick contacts.  No family history of cardiac disease or clotting disorders.  Independent Historian contributed to assessment above: Fianc bedside corroborates information past medical history as above     Physical Exam   Triage Vital Signs: ED Triage Vitals  Encounter Vitals Group     BP 12/17/23 0337 118/79     Girls Systolic BP Percentile --      Girls Diastolic BP Percentile --      Boys Systolic BP Percentile --      Boys Diastolic BP Percentile --      Pulse Rate 12/17/23 0337 (!) 121     Resp 12/17/23 0337 18     Temp 12/17/23 0337 98.7 F (37.1 C)     Temp Source 12/17/23 0342 Oral     SpO2 12/17/23 0337 100 %     Weight 12/17/23 0336 125 lb (56.7 kg)     Height 12/17/23 0336 5' 4 (1.626 m)     Head Circumference --      Peak Flow --      Pain Score 12/17/23 0336 7     Pain Loc --      Pain Education --      Exclude from Growth Chart --     Most recent vital signs: Vitals:   12/17/23 0430 12/17/23 0550  BP: 115/80 105/70  Pulse: (!) 106 88  Resp: (!) 26 20  Temp: (!) 100.7 F (38.2 C) 99.4 F (37.4 C)  SpO2: 100% 98%    General: Awake, no  distress.  CV:  Good peripheral perfusion.  Resp:  Normal effort.  Abd:  No distention.  Other:  Tachycardic and febrile.  Normotensive.  No hypoxemia.  No respiratory distress.  Lungs clear without obvious focality wheezing or rales.  Soft benign abdominal exam.  No significant peripheral edema.  Bedside ultrasound shows no gross wall motion abnormalities obvious right heart strain pericardial effusion and ejection fraction is grossly normal.   ED Results / Procedures / Treatments   Labs (all labs ordered are listed, but only abnormal results are displayed) Labs Reviewed  BASIC METABOLIC PANEL WITH GFR - Abnormal; Notable for the following components:      Result Value   Glucose, Bld 108 (*)    Creatinine, Ser 0.41 (*)    Calcium 8.7 (*)    All other components within normal limits  CBC - Abnormal; Notable for the following components:   Hemoglobin 10.9 (*)    HCT 34.4 (*)    All other components within normal limits  D-DIMER, QUANTITATIVE - Abnormal; Notable for the  following components:   D-Dimer, Quant 0.51 (*)    All other components within normal limits  HEPATIC FUNCTION PANEL - Abnormal; Notable for the following components:   Total Protein 8.5 (*)    All other components within normal limits  RESP PANEL BY RT-PCR (RSV, FLU A&B, COVID)  RVPGX2  BRAIN NATRIURETIC PEPTIDE  LIPASE, BLOOD  SEDIMENTATION RATE  POC URINE PREG, ED  TROPONIN I (HIGH SENSITIVITY)  TROPONIN I (HIGH SENSITIVITY)     I ordered and reviewed the above labs they are notable for D-dimer mildly elevated at 0.51.  Initial troponin normal.  EKG  ED ECG REPORT I, Ginnie Shams, the attending physician, personally viewed and interpreted this ECG.   Date: 12/17/2023  EKG Time: 0348  Rate: 115  Rhythm: sinus tachycardia  Axis: nl  Intervals:nl  ST&T Change: no stemi    RADIOLOGY I independently reviewed and interpreted CXR and see no obvious focality pneumothorax I also reviewed radiologist's  formal read.   PROCEDURES:  Critical Care performed: No  Procedures   MEDICATIONS ORDERED IN ED: Medications  acetaminophen  (TYLENOL ) tablet 650 mg (650 mg Oral Given 12/17/23 0354)  sodium chloride  0.9 % bolus 1,000 mL (0 mLs Intravenous Stopped 12/17/23 0602)  iohexol  (OMNIPAQUE ) 350 MG/ML injection 75 mL (75 mLs Intravenous Contrast Given 12/17/23 0449)  ketorolac  (TORADOL ) 15 MG/ML injection 15 mg (15 mg Intravenous Given 12/17/23 0553)     IMPRESSION / MDM / ASSESSMENT AND PLAN / ED COURSE  I reviewed the triage vital signs and the nursing notes.                                Patient's presentation is most consistent with acute presentation with potential threat to life or bodily function.  Differential diagnosis includes, but is not limited to, ACS, PE, dissection, respiratory infection, myocarditis or pericarditis  The patient is on the cardiac monitor to evaluate for evidence of arrhythmia and/or significant heart rate changes.  MDM:    This healthy young woman has had 2 days of progressively worsening chest tightness and shortness of breath, exertional dyspnea and chest pain, orthopnea and is tachycardic and mildly febrile.   I am concerned for myocarditis.  She has had no recent viral illnesses and denies any respiratory infectious symptoms, though I considered respiratory infection or sepsis I think this is less likely.  Will trial some IV fluids.  Hold on antibiotics unless the source presents itself.  Consider PE as well.  Dimer mildly elevated we will proceed with CT angiogram of the chest.   -- Workup unremarkable including CT angiogram, initial troponin and patient feels much improved with antipyretics, fluid bolus, and hemodynamics have gotten much better, tachycardia resolved and fever is gone.  I think myocarditis is less likely with normal BNP, normal troponin, improvement of symptoms with fluids.  Still in the differential however much less likely and  certainly no evidence of fulminant heart failure.  I gave her the option of staying in the hospital for further observation and treatment and cardiology consultation or discharge with outpatient follow-up and she elects to be discharged showed her second troponin showed no marked elevation.  I think this is reasonable and I will make a PMD referral and a cardiology referral for her to follow-up and I instructed her to return with any new or worsening symptoms.      FINAL CLINICAL IMPRESSION(S) / ED DIAGNOSES  Final diagnoses:  Chest pain, unspecified type  Exertional dyspnea  Fever, unspecified fever cause  Tachycardia     Rx / DC Orders   ED Discharge Orders          Ordered    Ambulatory Referral to Primary Care (Establish Care)        12/17/23 0617    Ambulatory referral to Cardiology       Comments: If you have not heard from the Cardiology office within the next 72 hours please call [phone].   12/17/23 0617             Note:  This document was prepared using Dragon voice recognition software and may include unintentional dictation errors.    Cyrena Mylar, MD 12/17/23 [phone]

## 2023-12-17 NOTE — ED Triage Notes (Signed)
 Pt reports chest pain and shortness of breath that began yesterday, pt denies cough or congestion. Pt states pain and sob worse when laying down. Pt denies known cardiac or resp issues.

## 2024-01-08 ENCOUNTER — Ambulatory Visit: Payer: PRIVATE HEALTH INSURANCE | Admitting: Family Medicine

## 2024-01-08 VITALS — BP 103/59 | HR 75 | Resp 16 | Ht 64.0 in | Wt 125.0 lb

## 2024-01-08 DIAGNOSIS — R079 Chest pain, unspecified: Secondary | ICD-10-CM

## 2024-01-08 DIAGNOSIS — M2550 Pain in unspecified joint: Secondary | ICD-10-CM | POA: Diagnosis not present

## 2024-01-08 DIAGNOSIS — I7300 Raynaud's syndrome without gangrene: Secondary | ICD-10-CM

## 2024-01-08 DIAGNOSIS — R091 Pleurisy: Secondary | ICD-10-CM

## 2024-01-08 NOTE — Progress Notes (Signed)
 Established Patient Office Visit  Subjective   Patient ID: Erica Johns, female    DOB: 29-Oct-1995  Age: 28 y.o. MRN: [id number]  Chief Complaint  Patient presents with   Hospitalization Follow-up    Hospital F/u Pt states she is still having stiffness in fingers and knees. She also states she is having trouble sleeping with pain on the left side. Fingers and toes seems to turn purple at times.    Erica Johns is 28 yo F who presents to the clinic for follow up after a recent ER visit for chest pain and shortness of breath.  On interview, she reports that in mid July she began experiencing episodes of chest pain and shortness of breath. She describes a left sided pulling/tugging sensation of the best through the left shoulder. She reports that she could not lay on her stomach, back, or left side during that period. She described the pain as a 8/10 with associated fever, chills, and sweating. She visited the ER who performed a robust work up for red flag conditions such as MI, PE, etc. Since her visit, she reports that her chest pain and shortness of breath have improved. She states that she still feels a pulling/tugging sensation through the left upper chest to the shoulder, but is able to lay on her back and stomach. She is still unable to lay on her left side. She rates the pain as a 2/10 now with symptoms improving on exertion. She has been taking ibuprofen for symptomatic relief.  During this period she also had had concerns about joint stiffness and swelling in her hands, knees, and ankles. She states that the stiffness was worse during the acute period of chest pain and shortness of breathe in July with gradual improvement. She still continues to have episodes of stiffness and swelling that she would like to discuss further along with her fingers and toes turning purple occasionally.  She reports no other concerns today.      ROS    Objective:     BP (!) 103/59 (BP  Location: Right Arm, Patient Position: Sitting)   Pulse 75   Resp 16   Ht 5' 4 (1.626 m)   Wt 125 lb (56.7 kg)   SpO2 95%   BMI 21.46 kg/m    Physical Exam Vitals reviewed.  Constitutional:      General: She is not in acute distress.    Appearance: Normal appearance. She is not ill-appearing or diaphoretic.  HENT:     Head: Normocephalic.     Right Ear: Tympanic membrane, ear canal and external ear normal.     Left Ear: Tympanic membrane, ear canal and external ear normal.     Nose: Nose normal.     Mouth/Throat:     Mouth: Mucous membranes are moist.     Pharynx: Oropharynx is clear. No oropharyngeal exudate or posterior oropharyngeal erythema.  Eyes:     General: No scleral icterus.    Conjunctiva/sclera: Conjunctivae normal.     Pupils: Pupils are equal, round, and reactive to light.  Cardiovascular:     Rate and Rhythm: Normal rate and regular rhythm.     Pulses: Normal pulses.     Heart sounds: Normal heart sounds. No murmur heard.    No friction rub. No gallop.  Pulmonary:     Effort: Pulmonary effort is normal. No respiratory distress.     Breath sounds: Normal breath sounds. No wheezing.  Abdominal:     General:  There is no distension.     Palpations: Abdomen is soft.     Tenderness: There is no abdominal tenderness. There is no guarding.  Musculoskeletal:     Cervical back: Normal range of motion.     Right lower leg: No edema.     Left lower leg: No edema.     Comments: Tenderness to palpation in the left upper chest to the shoulder.  Lymphadenopathy:     Cervical: No cervical adenopathy.  Skin:    General: Skin is warm and dry.     Capillary Refill: Capillary refill takes less than 2 seconds.  Neurological:     Mental Status: She is alert and oriented to person, place, and time.  Psychiatric:        Mood and Affect: Mood normal.      No results found for any visits on 01/08/24.    The ASCVD Risk score (Arnett DK, et al., 2019) failed to  calculate for the following reasons:   The 2019 ASCVD risk score is only valid for ages 88 to 84    Assessment & Plan:   Problem List Items Addressed This Visit   None Visit Diagnoses       Chest pain, unspecified type    -  Primary     Pleuritis       Relevant Orders   Ambulatory referral to Rheumatology     Raynaud's phenomenon without gangrene       Relevant Orders   Ambulatory referral to Rheumatology     Polyarthralgia       Relevant Orders   Rheumatoid factor   CYCLIC CITRUL PEPTIDE ANTIBODY, IGG/IGA   Sedimentation rate   C-reactive protein   ANA w/Reflex if Positive   Ambulatory referral to Rheumatology      Chest pain, unspecified type Pleuritis Presents with a 3 week history of chest pain, shortness of breath, and inspiratory  - Continue observation - Ambulatory referral to Rheumatology  Raynaud's phenomenon without gangrene Reports a multiyear history of distal extremities turning purple in response to colder temperatures with episodes lasting ~30 minutes with return to baseline in warmer conditions. Denies any other pain or symptoms associated. - Continue observation - Can consider calcium channel blocker for systematic relief - Ambulatory referral to Rheumatology  Polyarthralgia Reports a history of arthralgias in the hands/wrist, knees, and ankles often worse in the morning with improvement throughout the day. Episodes can last from a few days to a week with frequent recurrence. Reports stiffness of hands and feet with pain. Also has Raynaud's, increasing suspicion for autoimmune etiology. Differential includes: Lupus vs RA vs Psoriatic arthritis. - Order Rheumatoid Factor and Anti CCP antibody - Order ESR and Sed Rate - Order ANA w/reflex - Ambulatory referral to Rheumatology  Return if symptoms worsen or fail to improve.    Elia LULLA Blanch, Medical Student   Patient seen along with [employer] student, Elia Blanch. I personally evaluated this patient along  with the student, and verified all aspects of the history, physical exam, and medical decision making as documented by the student. I agree with the student's documentation and have made all necessary edits.  Tacy Chavis, Jon HERO, MD, MPH [facility]

## 2024-01-10 LAB — ANA W/REFLEX IF POSITIVE
Anti JO-1: <0.2 AI (ref 0.0–0.9)
Anti Nuclear Antibody (ANA): POSITIVE — AB
Centromere Ab Screen: <0.2 AI (ref 0.0–0.9)
Chromatin Ab SerPl-aCnc: >8 AI — ABNORMAL HIGH (ref 0.0–0.9)
ENA RNP Ab: >8 AI — ABNORMAL HIGH (ref 0.0–0.9)
ENA SM Ab Ser-aCnc: >8 AI — ABNORMAL HIGH (ref 0.0–0.9)
ENA SSA (RO) Ab: 0.3 AI (ref 0.0–0.9)
ENA SSB (LA) Ab: <0.2 AI (ref 0.0–0.9)
Scleroderma (Scl-70) (ENA) Antibody, IgG: 0.7 AI (ref 0.0–0.9)
dsDNA Ab: 27 [IU]/mL — ABNORMAL HIGH (ref 0–9)

## 2024-01-10 LAB — C-REACTIVE PROTEIN: CRP: <1 mg/L (ref 0–10)

## 2024-01-10 LAB — SEDIMENTATION RATE: Sed Rate: 56 mm/h — ABNORMAL HIGH (ref 0–32)

## 2024-01-10 LAB — RHEUMATOID FACTOR: Rheumatoid fact SerPl-aCnc: <10 [IU]/mL (ref <14.0)

## 2024-01-10 LAB — CYCLIC CITRUL PEPTIDE ANTIBODY, IGG/IGA: Cyclic Citrullin Peptide Ab: 9 U (ref 0–19)

## 2024-01-12 ENCOUNTER — Ambulatory Visit: Payer: Self-pay | Admitting: Family Medicine

## 2024-01-19 ENCOUNTER — Inpatient Hospital Stay: Payer: PRIVATE HEALTH INSURANCE | Admitting: Family Medicine

## 2024-01-20 MED ORDER — PREDNISONE 10 MG PO TABS
ORAL_TABLET | ORAL | 0 refills | Status: AC
Start: 2024-01-20 — End: ?

## 2024-03-26 ENCOUNTER — Encounter: Payer: Self-pay | Admitting: Family Medicine
